# Patient Record
Sex: Male | Born: 1955
Health system: Southern US, Community
[De-identification: ages and names within clinical notes are randomized; demographics above are authoritative.]

## PROBLEM LIST (undated history)

## (undated) DIAGNOSIS — K219 Gastro-esophageal reflux disease without esophagitis: Secondary | ICD-10-CM

## (undated) DIAGNOSIS — I10 Essential (primary) hypertension: Secondary | ICD-10-CM

## (undated) DIAGNOSIS — D649 Anemia, unspecified: Secondary | ICD-10-CM

## (undated) DIAGNOSIS — E785 Hyperlipidemia, unspecified: Secondary | ICD-10-CM

## (undated) DIAGNOSIS — R011 Cardiac murmur, unspecified: Secondary | ICD-10-CM

## (undated) DIAGNOSIS — M810 Age-related osteoporosis without current pathological fracture: Secondary | ICD-10-CM

## (undated) DIAGNOSIS — I4891 Unspecified atrial fibrillation: Secondary | ICD-10-CM

## (undated) DIAGNOSIS — F32A Depression, unspecified: Secondary | ICD-10-CM

## (undated) DIAGNOSIS — F419 Anxiety disorder, unspecified: Secondary | ICD-10-CM

## (undated) DIAGNOSIS — T7840XA Allergy, unspecified, initial encounter: Secondary | ICD-10-CM

## (undated) DIAGNOSIS — M1990 Unspecified osteoarthritis, unspecified site: Secondary | ICD-10-CM

## (undated) DIAGNOSIS — E119 Type 2 diabetes mellitus without complications: Secondary | ICD-10-CM

## (undated) HISTORY — DX: Age-related osteoporosis without current pathological fracture: M81.0

## (undated) HISTORY — PX: SPINE SURGERY: SHX786

## (undated) HISTORY — DX: Essential (primary) hypertension: I10

## (undated) HISTORY — DX: Allergy, unspecified, initial encounter: T78.40XA

## (undated) HISTORY — PX: JOINT REPLACEMENT: SHX530

## (undated) HISTORY — DX: Anemia, unspecified: D64.9

## (undated) HISTORY — DX: Depression, unspecified: F32.A

## (undated) HISTORY — DX: Unspecified osteoarthritis, unspecified site: M19.90

## (undated) HISTORY — DX: Cardiac murmur, unspecified: R01.1

## (undated) HISTORY — DX: Hyperlipidemia, unspecified: E78.5

## (undated) HISTORY — PX: FOOT SURGERY: SHX648

## (undated) HISTORY — PX: HERNIA REPAIR: SHX51

## (undated) HISTORY — DX: Gastro-esophageal reflux disease without esophagitis: K21.9

## (undated) HISTORY — DX: Type 2 diabetes mellitus without complications: E11.9

## (undated) HISTORY — DX: Anxiety disorder, unspecified: F41.9

## (undated) HISTORY — PX: KNEE SURGERY: SHX244

## (undated) HISTORY — PX: FRACTURE SURGERY: SHX138

## (undated) HISTORY — PX: CHOLECYSTECTOMY: SHX55

## (undated) HISTORY — DX: Unspecified atrial fibrillation: I48.91

---

## 2011-09-01 DIAGNOSIS — M069 Rheumatoid arthritis, unspecified: Secondary | ICD-10-CM | POA: Insufficient documentation

## 2011-09-22 DIAGNOSIS — R269 Unspecified abnormalities of gait and mobility: Secondary | ICD-10-CM | POA: Insufficient documentation

## 2011-09-22 DIAGNOSIS — M2170 Unequal limb length (acquired), unspecified site: Secondary | ICD-10-CM | POA: Insufficient documentation

## 2012-07-23 DIAGNOSIS — T849XXA Unspecified complication of internal orthopedic prosthetic device, implant and graft, initial encounter: Secondary | ICD-10-CM | POA: Insufficient documentation

## 2012-09-20 DIAGNOSIS — M25569 Pain in unspecified knee: Secondary | ICD-10-CM | POA: Insufficient documentation

## 2012-09-21 DIAGNOSIS — F32A Depression, unspecified: Secondary | ICD-10-CM | POA: Insufficient documentation

## 2013-10-18 DIAGNOSIS — G4700 Insomnia, unspecified: Secondary | ICD-10-CM | POA: Insufficient documentation

## 2013-10-18 DIAGNOSIS — M545 Low back pain, unspecified: Secondary | ICD-10-CM | POA: Insufficient documentation

## 2013-10-18 DIAGNOSIS — M961 Postlaminectomy syndrome, not elsewhere classified: Secondary | ICD-10-CM | POA: Insufficient documentation

## 2013-11-18 DIAGNOSIS — N529 Male erectile dysfunction, unspecified: Secondary | ICD-10-CM | POA: Insufficient documentation

## 2014-11-26 DIAGNOSIS — Z471 Aftercare following joint replacement surgery: Secondary | ICD-10-CM | POA: Insufficient documentation

## 2015-02-05 DIAGNOSIS — G43109 Migraine with aura, not intractable, without status migrainosus: Secondary | ICD-10-CM | POA: Insufficient documentation

## 2015-02-05 DIAGNOSIS — H524 Presbyopia: Secondary | ICD-10-CM | POA: Insufficient documentation

## 2015-02-05 DIAGNOSIS — H2510 Age-related nuclear cataract, unspecified eye: Secondary | ICD-10-CM | POA: Insufficient documentation

## 2015-03-03 DIAGNOSIS — E1151 Type 2 diabetes mellitus with diabetic peripheral angiopathy without gangrene: Secondary | ICD-10-CM | POA: Insufficient documentation

## 2016-05-02 DIAGNOSIS — H04129 Dry eye syndrome of unspecified lacrimal gland: Secondary | ICD-10-CM | POA: Insufficient documentation

## 2016-08-01 DIAGNOSIS — I472 Ventricular tachycardia, unspecified: Secondary | ICD-10-CM | POA: Insufficient documentation

## 2016-12-02 DIAGNOSIS — F17200 Nicotine dependence, unspecified, uncomplicated: Secondary | ICD-10-CM | POA: Insufficient documentation

## 2018-04-17 DIAGNOSIS — E559 Vitamin D deficiency, unspecified: Secondary | ICD-10-CM | POA: Insufficient documentation

## 2018-04-17 DIAGNOSIS — E781 Pure hyperglyceridemia: Secondary | ICD-10-CM | POA: Insufficient documentation

## 2018-04-17 DIAGNOSIS — N400 Enlarged prostate without lower urinary tract symptoms: Secondary | ICD-10-CM | POA: Insufficient documentation

## 2018-04-19 DIAGNOSIS — I10 Essential (primary) hypertension: Secondary | ICD-10-CM | POA: Insufficient documentation

## 2018-04-20 DIAGNOSIS — R634 Abnormal weight loss: Secondary | ICD-10-CM | POA: Insufficient documentation

## 2018-07-13 DIAGNOSIS — Z96649 Presence of unspecified artificial hip joint: Secondary | ICD-10-CM | POA: Insufficient documentation

## 2020-02-17 DIAGNOSIS — R110 Nausea: Secondary | ICD-10-CM | POA: Diagnosis not present

## 2020-02-17 DIAGNOSIS — R1011 Right upper quadrant pain: Secondary | ICD-10-CM | POA: Diagnosis not present

## 2020-02-17 DIAGNOSIS — R1013 Epigastric pain: Secondary | ICD-10-CM | POA: Diagnosis not present

## 2020-02-17 DIAGNOSIS — Z7984 Long term (current) use of oral hypoglycemic drugs: Secondary | ICD-10-CM | POA: Diagnosis not present

## 2020-02-17 DIAGNOSIS — I10 Essential (primary) hypertension: Secondary | ICD-10-CM | POA: Diagnosis not present

## 2020-02-17 DIAGNOSIS — K5730 Diverticulosis of large intestine without perforation or abscess without bleeding: Secondary | ICD-10-CM | POA: Diagnosis not present

## 2020-02-17 DIAGNOSIS — M549 Dorsalgia, unspecified: Secondary | ICD-10-CM | POA: Diagnosis not present

## 2020-02-17 DIAGNOSIS — E119 Type 2 diabetes mellitus without complications: Secondary | ICD-10-CM | POA: Diagnosis not present

## 2020-02-17 DIAGNOSIS — E785 Hyperlipidemia, unspecified: Secondary | ICD-10-CM | POA: Diagnosis not present

## 2020-02-17 DIAGNOSIS — Z79899 Other long term (current) drug therapy: Secondary | ICD-10-CM | POA: Diagnosis not present

## 2020-02-17 DIAGNOSIS — Z886 Allergy status to analgesic agent status: Secondary | ICD-10-CM | POA: Diagnosis not present

## 2020-02-17 DIAGNOSIS — K219 Gastro-esophageal reflux disease without esophagitis: Secondary | ICD-10-CM | POA: Diagnosis not present

## 2020-02-17 DIAGNOSIS — F17210 Nicotine dependence, cigarettes, uncomplicated: Secondary | ICD-10-CM | POA: Diagnosis not present

## 2020-02-17 DIAGNOSIS — N281 Cyst of kidney, acquired: Secondary | ICD-10-CM | POA: Diagnosis not present

## 2020-02-18 DIAGNOSIS — N281 Cyst of kidney, acquired: Secondary | ICD-10-CM | POA: Diagnosis not present

## 2020-02-18 DIAGNOSIS — K5730 Diverticulosis of large intestine without perforation or abscess without bleeding: Secondary | ICD-10-CM | POA: Diagnosis not present

## 2020-02-18 DIAGNOSIS — R1013 Epigastric pain: Secondary | ICD-10-CM | POA: Diagnosis not present

## 2020-02-27 DIAGNOSIS — K824 Cholesterolosis of gallbladder: Secondary | ICD-10-CM | POA: Diagnosis not present

## 2020-02-27 DIAGNOSIS — R1011 Right upper quadrant pain: Secondary | ICD-10-CM | POA: Diagnosis not present

## 2020-03-10 DIAGNOSIS — I10 Essential (primary) hypertension: Secondary | ICD-10-CM | POA: Diagnosis not present

## 2020-03-10 DIAGNOSIS — E119 Type 2 diabetes mellitus without complications: Secondary | ICD-10-CM | POA: Diagnosis not present

## 2020-03-10 DIAGNOSIS — B353 Tinea pedis: Secondary | ICD-10-CM | POA: Diagnosis not present

## 2020-03-10 DIAGNOSIS — K219 Gastro-esophageal reflux disease without esophagitis: Secondary | ICD-10-CM | POA: Diagnosis not present

## 2020-03-13 DIAGNOSIS — R932 Abnormal findings on diagnostic imaging of liver and biliary tract: Secondary | ICD-10-CM | POA: Diagnosis not present

## 2020-03-13 DIAGNOSIS — N281 Cyst of kidney, acquired: Secondary | ICD-10-CM | POA: Diagnosis not present

## 2020-03-13 DIAGNOSIS — R1011 Right upper quadrant pain: Secondary | ICD-10-CM | POA: Diagnosis not present

## 2020-09-10 DIAGNOSIS — E119 Type 2 diabetes mellitus without complications: Secondary | ICD-10-CM | POA: Diagnosis not present

## 2020-09-10 DIAGNOSIS — F4321 Adjustment disorder with depressed mood: Secondary | ICD-10-CM | POA: Diagnosis not present

## 2020-09-10 DIAGNOSIS — R457 State of emotional shock and stress, unspecified: Secondary | ICD-10-CM | POA: Diagnosis not present

## 2020-09-10 DIAGNOSIS — I10 Essential (primary) hypertension: Secondary | ICD-10-CM | POA: Diagnosis not present

## 2020-11-05 DIAGNOSIS — F4323 Adjustment disorder with mixed anxiety and depressed mood: Secondary | ICD-10-CM | POA: Diagnosis not present

## 2020-11-05 DIAGNOSIS — K589 Irritable bowel syndrome without diarrhea: Secondary | ICD-10-CM | POA: Diagnosis not present

## 2020-11-05 DIAGNOSIS — Z23 Encounter for immunization: Secondary | ICD-10-CM | POA: Diagnosis not present

## 2020-11-05 DIAGNOSIS — E119 Type 2 diabetes mellitus without complications: Secondary | ICD-10-CM | POA: Diagnosis not present

## 2020-11-09 DIAGNOSIS — E119 Type 2 diabetes mellitus without complications: Secondary | ICD-10-CM | POA: Diagnosis not present

## 2020-12-25 DIAGNOSIS — R0602 Shortness of breath: Secondary | ICD-10-CM | POA: Diagnosis not present

## 2020-12-25 DIAGNOSIS — F419 Anxiety disorder, unspecified: Secondary | ICD-10-CM | POA: Diagnosis not present

## 2020-12-25 DIAGNOSIS — F17210 Nicotine dependence, cigarettes, uncomplicated: Secondary | ICD-10-CM | POA: Diagnosis not present

## 2020-12-25 DIAGNOSIS — E785 Hyperlipidemia, unspecified: Secondary | ICD-10-CM | POA: Diagnosis not present

## 2020-12-25 DIAGNOSIS — R55 Syncope and collapse: Secondary | ICD-10-CM | POA: Diagnosis not present

## 2020-12-25 DIAGNOSIS — Z7984 Long term (current) use of oral hypoglycemic drugs: Secondary | ICD-10-CM | POA: Diagnosis not present

## 2020-12-25 DIAGNOSIS — K219 Gastro-esophageal reflux disease without esophagitis: Secondary | ICD-10-CM | POA: Diagnosis not present

## 2020-12-25 DIAGNOSIS — Z79899 Other long term (current) drug therapy: Secondary | ICD-10-CM | POA: Diagnosis not present

## 2020-12-25 DIAGNOSIS — Z885 Allergy status to narcotic agent status: Secondary | ICD-10-CM | POA: Diagnosis not present

## 2020-12-25 DIAGNOSIS — E119 Type 2 diabetes mellitus without complications: Secondary | ICD-10-CM | POA: Diagnosis not present

## 2020-12-25 DIAGNOSIS — I10 Essential (primary) hypertension: Secondary | ICD-10-CM | POA: Diagnosis not present

## 2020-12-25 DIAGNOSIS — R079 Chest pain, unspecified: Secondary | ICD-10-CM | POA: Diagnosis not present

## 2020-12-25 DIAGNOSIS — Z7982 Long term (current) use of aspirin: Secondary | ICD-10-CM | POA: Diagnosis not present

## 2021-03-02 DIAGNOSIS — R011 Cardiac murmur, unspecified: Secondary | ICD-10-CM | POA: Diagnosis not present

## 2021-03-02 DIAGNOSIS — R072 Precordial pain: Secondary | ICD-10-CM | POA: Diagnosis not present

## 2021-03-02 DIAGNOSIS — K219 Gastro-esophageal reflux disease without esophagitis: Secondary | ICD-10-CM | POA: Diagnosis not present

## 2021-03-02 DIAGNOSIS — F419 Anxiety disorder, unspecified: Secondary | ICD-10-CM | POA: Diagnosis not present

## 2021-04-14 DIAGNOSIS — Z7982 Long term (current) use of aspirin: Secondary | ICD-10-CM | POA: Diagnosis not present

## 2021-04-14 DIAGNOSIS — I10 Essential (primary) hypertension: Secondary | ICD-10-CM | POA: Diagnosis not present

## 2021-04-14 DIAGNOSIS — N281 Cyst of kidney, acquired: Secondary | ICD-10-CM | POA: Diagnosis not present

## 2021-04-14 DIAGNOSIS — E119 Type 2 diabetes mellitus without complications: Secondary | ICD-10-CM | POA: Diagnosis not present

## 2021-04-14 DIAGNOSIS — R110 Nausea: Secondary | ICD-10-CM | POA: Diagnosis not present

## 2021-04-14 DIAGNOSIS — R1084 Generalized abdominal pain: Secondary | ICD-10-CM | POA: Diagnosis not present

## 2021-04-14 DIAGNOSIS — Z885 Allergy status to narcotic agent status: Secondary | ICD-10-CM | POA: Diagnosis not present

## 2021-04-14 DIAGNOSIS — L089 Local infection of the skin and subcutaneous tissue, unspecified: Secondary | ICD-10-CM | POA: Diagnosis not present

## 2021-04-14 DIAGNOSIS — R1011 Right upper quadrant pain: Secondary | ICD-10-CM | POA: Diagnosis not present

## 2021-04-14 DIAGNOSIS — F17210 Nicotine dependence, cigarettes, uncomplicated: Secondary | ICD-10-CM | POA: Diagnosis not present

## 2021-04-14 DIAGNOSIS — K5730 Diverticulosis of large intestine without perforation or abscess without bleeding: Secondary | ICD-10-CM | POA: Diagnosis not present

## 2021-04-14 DIAGNOSIS — K5790 Diverticulosis of intestine, part unspecified, without perforation or abscess without bleeding: Secondary | ICD-10-CM | POA: Diagnosis not present

## 2021-04-14 DIAGNOSIS — Z7984 Long term (current) use of oral hypoglycemic drugs: Secondary | ICD-10-CM | POA: Diagnosis not present

## 2021-04-14 DIAGNOSIS — K2970 Gastritis, unspecified, without bleeding: Secondary | ICD-10-CM | POA: Diagnosis not present

## 2021-04-14 DIAGNOSIS — R197 Diarrhea, unspecified: Secondary | ICD-10-CM | POA: Diagnosis not present

## 2021-04-14 DIAGNOSIS — R109 Unspecified abdominal pain: Secondary | ICD-10-CM | POA: Diagnosis not present

## 2021-04-14 DIAGNOSIS — E785 Hyperlipidemia, unspecified: Secondary | ICD-10-CM | POA: Diagnosis not present

## 2021-04-14 DIAGNOSIS — K3189 Other diseases of stomach and duodenum: Secondary | ICD-10-CM | POA: Diagnosis not present

## 2021-04-14 DIAGNOSIS — Z79899 Other long term (current) drug therapy: Secondary | ICD-10-CM | POA: Diagnosis not present

## 2021-04-14 DIAGNOSIS — R059 Cough, unspecified: Secondary | ICD-10-CM | POA: Diagnosis not present

## 2021-04-26 DIAGNOSIS — K8020 Calculus of gallbladder without cholecystitis without obstruction: Secondary | ICD-10-CM | POA: Insufficient documentation

## 2021-04-30 ENCOUNTER — Ambulatory Visit: Payer: Self-pay | Admitting: Family Medicine

## 2021-05-07 DIAGNOSIS — Z96642 Presence of left artificial hip joint: Secondary | ICD-10-CM | POA: Diagnosis not present

## 2021-05-07 DIAGNOSIS — Z7984 Long term (current) use of oral hypoglycemic drugs: Secondary | ICD-10-CM | POA: Diagnosis not present

## 2021-05-07 DIAGNOSIS — Z7982 Long term (current) use of aspirin: Secondary | ICD-10-CM | POA: Diagnosis not present

## 2021-05-07 DIAGNOSIS — I10 Essential (primary) hypertension: Secondary | ICD-10-CM | POA: Diagnosis not present

## 2021-05-07 DIAGNOSIS — E119 Type 2 diabetes mellitus without complications: Secondary | ICD-10-CM | POA: Diagnosis not present

## 2021-05-07 DIAGNOSIS — K824 Cholesterolosis of gallbladder: Secondary | ICD-10-CM | POA: Diagnosis not present

## 2021-05-07 DIAGNOSIS — E781 Pure hyperglyceridemia: Secondary | ICD-10-CM | POA: Diagnosis not present

## 2021-05-07 DIAGNOSIS — G43909 Migraine, unspecified, not intractable, without status migrainosus: Secondary | ICD-10-CM | POA: Diagnosis not present

## 2021-05-07 DIAGNOSIS — I739 Peripheral vascular disease, unspecified: Secondary | ICD-10-CM | POA: Diagnosis not present

## 2021-05-07 DIAGNOSIS — I472 Ventricular tachycardia: Secondary | ICD-10-CM | POA: Diagnosis not present

## 2021-05-07 DIAGNOSIS — K219 Gastro-esophageal reflux disease without esophagitis: Secondary | ICD-10-CM | POA: Diagnosis not present

## 2021-05-07 DIAGNOSIS — Z885 Allergy status to narcotic agent status: Secondary | ICD-10-CM | POA: Diagnosis not present

## 2021-05-07 DIAGNOSIS — K811 Chronic cholecystitis: Secondary | ICD-10-CM | POA: Diagnosis not present

## 2021-05-07 DIAGNOSIS — E785 Hyperlipidemia, unspecified: Secondary | ICD-10-CM | POA: Diagnosis not present

## 2021-05-07 DIAGNOSIS — Z79899 Other long term (current) drug therapy: Secondary | ICD-10-CM | POA: Diagnosis not present

## 2021-05-07 DIAGNOSIS — K8020 Calculus of gallbladder without cholecystitis without obstruction: Secondary | ICD-10-CM | POA: Diagnosis not present

## 2021-05-07 DIAGNOSIS — F17210 Nicotine dependence, cigarettes, uncomplicated: Secondary | ICD-10-CM | POA: Diagnosis not present

## 2021-05-13 ENCOUNTER — Ambulatory Visit: Payer: Self-pay | Admitting: Family Medicine

## 2021-11-02 ENCOUNTER — Other Ambulatory Visit: Payer: Self-pay

## 2021-11-02 ENCOUNTER — Encounter: Payer: Self-pay | Admitting: Medical-Surgical

## 2021-11-02 ENCOUNTER — Ambulatory Visit (INDEPENDENT_AMBULATORY_CARE_PROVIDER_SITE_OTHER): Payer: Federal, State, Local not specified - PPO | Admitting: Medical-Surgical

## 2021-11-02 VITALS — BP 122/72 | HR 76 | Temp 98.4°F | Ht 70.5 in | Wt 196.2 lb

## 2021-11-02 DIAGNOSIS — F32A Depression, unspecified: Secondary | ICD-10-CM

## 2021-11-02 DIAGNOSIS — I10 Essential (primary) hypertension: Secondary | ICD-10-CM

## 2021-11-02 DIAGNOSIS — E1151 Type 2 diabetes mellitus with diabetic peripheral angiopathy without gangrene: Secondary | ICD-10-CM | POA: Diagnosis not present

## 2021-11-02 DIAGNOSIS — Z0000 Encounter for general adult medical examination without abnormal findings: Secondary | ICD-10-CM

## 2021-11-02 DIAGNOSIS — Z1159 Encounter for screening for other viral diseases: Secondary | ICD-10-CM | POA: Diagnosis not present

## 2021-11-02 DIAGNOSIS — I472 Ventricular tachycardia, unspecified: Secondary | ICD-10-CM | POA: Diagnosis not present

## 2021-11-02 DIAGNOSIS — Z7689 Persons encountering health services in other specified circumstances: Secondary | ICD-10-CM | POA: Diagnosis not present

## 2021-11-02 DIAGNOSIS — Z13220 Encounter for screening for lipoid disorders: Secondary | ICD-10-CM

## 2021-11-02 DIAGNOSIS — Z13228 Encounter for screening for other metabolic disorders: Secondary | ICD-10-CM

## 2021-11-02 DIAGNOSIS — K219 Gastro-esophageal reflux disease without esophagitis: Secondary | ICD-10-CM | POA: Diagnosis not present

## 2021-11-02 DIAGNOSIS — E785 Hyperlipidemia, unspecified: Secondary | ICD-10-CM | POA: Insufficient documentation

## 2021-11-02 DIAGNOSIS — Z114 Encounter for screening for human immunodeficiency virus [HIV]: Secondary | ICD-10-CM

## 2021-11-02 DIAGNOSIS — E782 Mixed hyperlipidemia: Secondary | ICD-10-CM

## 2021-11-02 DIAGNOSIS — E781 Pure hyperglyceridemia: Secondary | ICD-10-CM | POA: Diagnosis not present

## 2021-11-02 DIAGNOSIS — F5101 Primary insomnia: Secondary | ICD-10-CM

## 2021-11-02 LAB — POCT GLYCOSYLATED HEMOGLOBIN (HGB A1C): Hemoglobin A1C: 6.3 % — AB (ref 4.0–5.6)

## 2021-11-02 NOTE — Progress Notes (Signed)
New Patient Office Visit  Subjective:  Patient ID: Richard Fuller, male    DOB: November 18, 1956  Age: 65 y.o. MRN: [id number]  CC:  Chief Complaint  Patient presents with   Establish Care     HPI Richard Fuller presents to establish care.  He is a very pleasant 65 year old male who presents today with several chronic conditions.  Hypertension-checking blood pressures at home with average systolic readings of [id number].  Taking amlodipine 5 mg daily, carvedilol 25 mg daily as needed, and enalapril 20 mg daily as prescribed, tolerating well without side effects.  He has had some episodes over the past few months where he has dizziness where he feels like he is going to pass out.  He is not sure if this is related to his blood pressure or sugar.  Usually after eating a snack and lying down for a while, his symptoms resolved.  Denies chest pain, palpitations, lower extremity edema, vision changes, headaches, and shortness of breath.  Diabetes-diagnosed 7 years ago.  He checks his blood sugar intermittently.  His last result was 160 after breakfast yesterday morning.  He takes his metformin differently than prescribed as he is unable to tolerate twice a day dosing.  He reports taking 1/2 tablet every morning.  Hyperlipidemia-taking fenofibrate as prescribed, tolerating well without side effects.  GERD-does have significant issues with GERD and feels like his stomach is raw at times.  Taking Protonix 40 mg daily which she reports helps manage his symptoms.  Insomnia-using Ambien 1/2 tablet at bedtime as needed.  Uses this most nights and notes that it does help without side effects.  Mood-taking Lexapro 10 mg daily, tolerating well without side effects.  Notes that he had quite a bit of a rough time with his dad's passing and his siblings behavior surrounding that time.  Notes that he stays to himself and does not talk to his siblings much anymore because of it.  Feels that the Lexapro keeps his mood stable and  helps prevent emotional outburst.  Chronic pain-was previously seen at a pain clinic where they had him on a couple of different opioid pain medications.  Notes that he felt horrible on these medications and decided he would just live with the pain.  Admits to smoking marijuana a couple of times a week which helps tremendously with pain control for him.  Past Medical History:  Diagnosis Date   Anxiety    Atrial fibrillation ([place])    Depression    Diabetes mellitus without complication ([place])    Hyperlipidemia    Hypertension     Past Surgical History:  Procedure Laterality Date   CHOLECYSTECTOMY     FOOT SURGERY     HERNIA REPAIR     KNEE SURGERY      Family History  Problem Relation Age of Onset   Diabetes Mother    Hypertension Mother    Breast cancer Mother    Diabetes Brother    Hypertension Brother    Hypertension Maternal Grandfather    Diabetes Maternal Grandfather     Social History   Socioeconomic History   Marital status: Married    Spouse name: Not on file   Number of children: Not on file   Years of education: Not on file   Highest education level: Not on file  Occupational History   Not on file  Tobacco Use   Smoking status: Every Day    Types: Cigarettes   Smokeless tobacco: Never  Substance and  Sexual Activity   Alcohol use: Not Currently   Drug use: Yes    Types: Marijuana    Comment: 1-2 uses per week   Sexual activity: Yes    Partners: Female  Other Topics Concern   Not on file  Social History Narrative   Not on file   Social Determinants of Health   Financial Resource Strain: Not on file  Food Insecurity: Not on file  Transportation Needs: Not on file  Physical Activity: Not on file  Stress: Not on file  Social Connections: Not on file  Intimate Partner Violence: Not on file    ROS Review of Systems  Constitutional:  Negative for appetite change, chills, fatigue, fever and unexpected weight change.  Eyes:  Negative for visual  disturbance.  Respiratory:  Negative for cough, chest tightness, shortness of breath and wheezing.   Cardiovascular:  Negative for chest pain, palpitations and leg swelling.  Gastrointestinal:  Positive for abdominal pain. Negative for blood in stool, constipation, diarrhea, nausea and vomiting.  Endocrine: Negative for cold intolerance, heat intolerance, polydipsia, polyphagia and polyuria.  Genitourinary:  Negative for dysuria, frequency, hematuria and urgency.  Musculoskeletal:  Positive for arthralgias and myalgias.  Neurological:  Positive for light-headedness. Negative for dizziness, syncope and headaches.  Psychiatric/Behavioral:  Negative for dysphoric mood, self-injury, sleep disturbance and suicidal ideas. The patient is not nervous/anxious.    Objective:   Today's Vitals: BP 122/72   Pulse 76   Temp 98.4 F (36.9 C)   Ht 5' 10.5" (1.791 m)   Wt 196 lb 3.2 oz (89 kg)   SpO2 98%   BMI 27.75 kg/m   Physical Exam Vitals and nursing note reviewed.  Constitutional:      General: He is not in acute distress.    Appearance: Normal appearance. He is not ill-appearing.  HENT:     Head: Normocephalic and atraumatic.  Cardiovascular:     Rate and Rhythm: Normal rate and regular rhythm.     Pulses: Normal pulses.     Heart sounds: Normal heart sounds. No murmur heard.   No friction rub. No gallop.  Pulmonary:     Effort: Pulmonary effort is normal. No respiratory distress.     Breath sounds: Normal breath sounds.  Skin:    General: Skin is warm and dry.  Neurological:     Mental Status: He is alert and oriented to person, place, and time.  Psychiatric:        Mood and Affect: Mood normal.        Behavior: Behavior normal.        Thought Content: Thought content normal.        Judgment: Judgment normal.    Assessment & Plan:   1. Encounter to establish care Reviewed available information and discussed care concerns with patient.   2. Diabetes mellitus with peripheral  vascular disease (HCC) POCT hemoglobin A1c 6.3% today indicating good control of diabetes.  Continue metformin as tolerated.  Checking labs as below. - Lipid panel - COMPLETE METABOLIC PANEL WITH GFR - CBC with Differential/Platelet - POCT glycosylated hemoglobin (Hb A1C)  3. Gastroesophageal reflux disease without esophagitis Continue Protonix 40 mg daily.  If symptoms not well controlled, consider adding famotidine 20 mg at bedtime as needed.  4. Mixed hyperlipidemia Checking lipid panel.  Continue fenofibrate as prescribed. - Lipid panel  5. Primary hypertension Checking labs as below.  Blood pressure well controlled so continue enalapril, amlodipine, and carvedilol as prescribed. - Lipid panel -  COMPLETE METABOLIC PANEL WITH GFR - CBC with Differential/Platelet  6. Primary insomnia Continue Ambien 5-10 mg nightly as needed.  7. Depression, unspecified depression type Continue Lexapro 10 mg daily.  Symptoms well controlled.  8. Screening for metabolic disorder Checking TSH. - TSH  9. Need for hepatitis C screening test 10. Screening for HIV (human immunodeficiency virus) Discussed screening recommendations.  Patient is agreeable so adding to blood work today. - Hepatitis C antibody - HIV Antibody (routine testing w rflx)   Outpatient Encounter Medications as of 11/02/2021  Medication Sig   amLODipine (NORVASC) 5 MG tablet Take 5 mg by mouth daily.   ASPIRIN LOW DOSE 81 MG EC tablet Take 81 mg by mouth daily.   carvedilol (COREG) 25 MG tablet Take 1 tablet by mouth daily as needed.   dicyclomine (BENTYL) 20 MG tablet Take 20 mg by mouth 3 (three) times daily.   enalapril (VASOTEC) 20 MG tablet Take 20 mg by mouth 2 (two) times daily.   escitalopram (LEXAPRO) 10 MG tablet Take 10 mg by mouth daily.   fenofibrate (TRICOR) 48 MG tablet Take 48 mg by mouth daily.   gabapentin (NEURONTIN) 300 MG capsule Take 300 mg by mouth 3 (three) times daily.   metFORMIN (GLUCOPHAGE)  500 MG tablet Take 500 mg by mouth 2 (two) times daily.   pantoprazole (PROTONIX) 40 MG tablet Take 40 mg by mouth 2 (two) times daily.   zolpidem (AMBIEN) 10 MG tablet Take 10 mg by mouth at bedtime as needed.   No facility-administered encounter medications on file as of 11/02/2021.    Follow-up: Return in about 6 months (around 05/02/2022) for chronic disease follow up.   Clearnce Sorrel, DNP, APRN, FNP-BC [facility]

## 2021-11-04 LAB — LIPID PANEL
Cholesterol: 171 mg/dL (ref <200)
HDL: 47 mg/dL (ref >=40)
LDL Cholesterol (Calc): 103 mg/dL (calc) — ABNORMAL HIGH
Non-HDL Cholesterol (Calc): 124 mg/dL (calc) (ref <130)
Total CHOL/HDL Ratio: 3.6 (calc) (ref <5.0)
Triglycerides: 113 mg/dL (ref <150)

## 2021-11-04 LAB — COMPLETE METABOLIC PANEL WITH GFR
AG Ratio: 1.7 (calc) (ref 1.0–2.5)
ALT: 19 U/L (ref 9–46)
AST: 26 U/L (ref 10–35)
Albumin: 4.7 g/dL (ref 3.6–5.1)
Alkaline phosphatase (APISO): 41 U/L (ref 35–144)
BUN: 15 mg/dL (ref 7–25)
CO2: 22 mmol/L (ref 20–32)
Calcium: 9.8 mg/dL (ref 8.6–10.3)
Chloride: 106 mmol/L (ref 98–110)
Creat: 0.91 mg/dL (ref 0.70–1.35)
Globulin: 2.8 g/dL (calc) (ref 1.9–3.7)
Glucose, Bld: 136 mg/dL (ref 65–139)
Potassium: 4.5 mmol/L (ref 3.5–5.3)
Sodium: 141 mmol/L (ref 135–146)
Total Bilirubin: 0.7 mg/dL (ref 0.2–1.2)
Total Protein: 7.5 g/dL (ref 6.1–8.1)
eGFR: 94 mL/min/{1.73_m2} (ref >=60)

## 2021-11-04 LAB — CBC WITH DIFFERENTIAL/PLATELET
Absolute Monocytes: 498 cells/uL (ref 200–950)
Basophils Absolute: 9 cells/uL (ref 0–200)
Basophils Relative: 0.2 %
Eosinophils Absolute: 202 cells/uL (ref 15–500)
Eosinophils Relative: 4.3 %
HCT: 40.2 % (ref 38.5–50.0)
Hemoglobin: 13.7 g/dL (ref 13.2–17.1)
Lymphs Abs: 949 cells/uL (ref 850–3900)
MCH: 31.8 pg (ref 27.0–33.0)
MCHC: 34.1 g/dL (ref 32.0–36.0)
MCV: 93.3 fL (ref 80.0–100.0)
MPV: 10.7 fL (ref 7.5–12.5)
Monocytes Relative: 10.6 %
Neutro Abs: 3041 cells/uL (ref 1500–7800)
Neutrophils Relative %: 64.7 %
Platelets: 210 10*3/uL (ref 140–400)
RBC: 4.31 10*6/uL (ref 4.20–5.80)
RDW: 13 % (ref 11.0–15.0)
Total Lymphocyte: 20.2 %
WBC: 4.7 10*3/uL (ref 3.8–10.8)

## 2021-11-04 LAB — HEPATITIS C ANTIBODY
Hepatitis C Ab: NONREACTIVE
SIGNAL TO CUT-OFF: 0.12 (ref <1.00)

## 2021-11-04 LAB — HIV ANTIBODY (ROUTINE TESTING W REFLEX): HIV 1&2 Ab, 4th Generation: NONREACTIVE

## 2021-11-04 LAB — TSH: TSH: 0.75 mIU/L (ref 0.40–4.50)

## 2021-11-04 MED ORDER — PRAVASTATIN SODIUM 10 MG PO TABS: 10.0000 mg | ORAL_TABLET | Freq: Every day | ORAL | 0 refills | Status: DC

## 2021-11-04 NOTE — Addendum Note (Signed)
Addended byChristen Butter on: 11/04/2021 12:39 PM   Modules accepted: Orders

## 2022-01-28 DIAGNOSIS — E785 Hyperlipidemia, unspecified: Secondary | ICD-10-CM | POA: Diagnosis not present

## 2022-01-28 DIAGNOSIS — R1110 Vomiting, unspecified: Secondary | ICD-10-CM | POA: Diagnosis not present

## 2022-01-28 DIAGNOSIS — Z7984 Long term (current) use of oral hypoglycemic drugs: Secondary | ICD-10-CM | POA: Diagnosis not present

## 2022-01-28 DIAGNOSIS — R509 Fever, unspecified: Secondary | ICD-10-CM | POA: Diagnosis not present

## 2022-01-28 DIAGNOSIS — R638 Other symptoms and signs concerning food and fluid intake: Secondary | ICD-10-CM | POA: Diagnosis not present

## 2022-01-28 DIAGNOSIS — R6883 Chills (without fever): Secondary | ICD-10-CM | POA: Diagnosis not present

## 2022-01-28 DIAGNOSIS — Z79899 Other long term (current) drug therapy: Secondary | ICD-10-CM | POA: Diagnosis not present

## 2022-01-28 DIAGNOSIS — R5383 Other fatigue: Secondary | ICD-10-CM | POA: Diagnosis not present

## 2022-01-28 DIAGNOSIS — Z885 Allergy status to narcotic agent status: Secondary | ICD-10-CM | POA: Diagnosis not present

## 2022-01-28 DIAGNOSIS — R059 Cough, unspecified: Secondary | ICD-10-CM | POA: Diagnosis not present

## 2022-01-28 DIAGNOSIS — K219 Gastro-esophageal reflux disease without esophagitis: Secondary | ICD-10-CM | POA: Diagnosis not present

## 2022-01-28 DIAGNOSIS — I10 Essential (primary) hypertension: Secondary | ICD-10-CM | POA: Diagnosis not present

## 2022-01-28 DIAGNOSIS — Z7982 Long term (current) use of aspirin: Secondary | ICD-10-CM | POA: Diagnosis not present

## 2022-01-28 DIAGNOSIS — U071 COVID-19: Secondary | ICD-10-CM | POA: Diagnosis not present

## 2022-01-28 DIAGNOSIS — F17210 Nicotine dependence, cigarettes, uncomplicated: Secondary | ICD-10-CM | POA: Diagnosis not present

## 2022-01-28 DIAGNOSIS — E1169 Type 2 diabetes mellitus with other specified complication: Secondary | ICD-10-CM | POA: Diagnosis not present

## 2022-01-31 ENCOUNTER — Telehealth: Payer: Self-pay | Admitting: General Practice

## 2022-01-31 NOTE — Telephone Encounter (Signed)
Transition Care Management Unsuccessful Follow-up Telephone Call  Date of discharge and from where:  01/28/22 from [facility]  Attempts:  1st Attempt  Reason for unsuccessful TCM follow-up call:  Left voice message

## 2022-02-02 NOTE — Telephone Encounter (Addendum)
Transition Care Management Unsuccessful Follow-up Telephone Call  Date of discharge and from where:  01/28/22 from [facility]  Attempts:  2nd attempt  Reason for unsuccessful TCM follow-up call:  Left voice message

## 2022-02-03 ENCOUNTER — Other Ambulatory Visit: Payer: Self-pay | Admitting: Medical-Surgical

## 2022-02-04 NOTE — Telephone Encounter (Signed)
Transition Care Management Unsuccessful Follow-up Telephone Call  Date of discharge and from where:  01/28/22 from [facility]  Attempts:  3rd Attempt  Reason for unsuccessful TCM follow-up call:  No answer/busy

## 2022-02-25 ENCOUNTER — Ambulatory Visit: Payer: Medicare Other | Admitting: Medical-Surgical

## 2022-03-01 ENCOUNTER — Ambulatory Visit (INDEPENDENT_AMBULATORY_CARE_PROVIDER_SITE_OTHER): Payer: Medicare Other

## 2022-03-01 ENCOUNTER — Ambulatory Visit (INDEPENDENT_AMBULATORY_CARE_PROVIDER_SITE_OTHER): Payer: Medicare Other | Admitting: Sports Medicine

## 2022-03-01 ENCOUNTER — Other Ambulatory Visit: Payer: Self-pay

## 2022-03-01 DIAGNOSIS — M961 Postlaminectomy syndrome, not elsewhere classified: Secondary | ICD-10-CM

## 2022-03-01 DIAGNOSIS — M545 Low back pain, unspecified: Secondary | ICD-10-CM

## 2022-03-01 DIAGNOSIS — M7542 Impingement syndrome of left shoulder: Secondary | ICD-10-CM | POA: Diagnosis not present

## 2022-03-01 DIAGNOSIS — Z96649 Presence of unspecified artificial hip joint: Secondary | ICD-10-CM

## 2022-03-01 DIAGNOSIS — M2578 Osteophyte, vertebrae: Secondary | ICD-10-CM | POA: Diagnosis not present

## 2022-03-01 DIAGNOSIS — M19012 Primary osteoarthritis, left shoulder: Secondary | ICD-10-CM | POA: Diagnosis not present

## 2022-03-01 DIAGNOSIS — Z96642 Presence of left artificial hip joint: Secondary | ICD-10-CM | POA: Diagnosis not present

## 2022-03-01 DIAGNOSIS — Z471 Aftercare following joint replacement surgery: Secondary | ICD-10-CM | POA: Diagnosis not present

## 2022-03-01 MED ORDER — GABAPENTIN 300 MG PO CAPS: ORAL_CAPSULE | ORAL | 3 refills | Status: DC

## 2022-03-01 MED ORDER — MELOXICAM 15 MG PO TABS: ORAL_TABLET | ORAL | 3 refills | Status: DC

## 2022-03-01 MED ORDER — PREDNISONE 50 MG PO TABS: ORAL_TABLET | ORAL | 0 refills | Status: DC

## 2022-03-01 NOTE — Assessment & Plan Note (Signed)
Impingement pain and signs on exam today, moderate pain, history of cuff repair. ?Adding x-rays, formal physical therapy. ?

## 2022-03-01 NOTE — Assessment & Plan Note (Signed)
Richard Fuller has had multiple revisions of a left total hip arthroplasty secondary to dislocations, he has pain in the buttock, his hip motion is good, I think his pain is more from his back rather than his hip arthroplasty. ?

## 2022-03-01 NOTE — Assessment & Plan Note (Signed)
Mit is a pleasant 66 year old male, he has chronic axial low back pain with radiation into the left buttock, thigh but not past the knee. ?He has had multiple injections, surgeries. ?Continues to have discomfort. ?This is likely failed back surgery syndrome and he is not interested in additional injections or surgery. ?We did discuss the natural history of back pain, we will get him out of his current pain with steroids followed by meloxicam, he is also only on 300 mg of Neurontin, he understands we need to obtain for this aggressively. ?I am also going to get him some physical therapy near his apartment. ?Return to see me in 6 weeks. ?

## 2022-03-01 NOTE — Progress Notes (Signed)
? ? ?  Procedures performed today:   ? ?None. ? ?Independent interpretation of notes and tests performed by another provider:  ? ?None. ? ?Brief History, Exam, Impression, and Recommendations:   ? ?Failed back surgical syndrome ?Richard Fuller is a pleasant 66 year old male, he has chronic axial low back pain with radiation into the left buttock, thigh but not past the knee. ?He has had multiple injections, surgeries. ?Continues to have discomfort. ?This is likely failed back surgery syndrome and he is not interested in additional injections or surgery. ?We did discuss the natural history of back pain, we will get him out of his current pain with steroids followed by meloxicam, he is also only on 300 mg of Neurontin, he understands we need to obtain for this aggressively. ?I am also going to get him some physical therapy near his apartment. ?Return to see me in 6 weeks. ? ?Impingement syndrome, shoulder, left ?Impingement pain and signs on exam today, moderate pain, history of cuff repair. ?Adding x-rays, formal physical therapy. ? ?S/P revision of total hip ?Richard Fuller has had multiple revisions of a left total hip arthroplasty secondary to dislocations, he has pain in the buttock, his hip motion is good, I think his pain is more from his back rather than his hip arthroplasty. ? ? ? ?___________________________________________ ?Richard Fuller. Richard Fuller, M.D., ABFM., CAQSM. ?Primary Care and Sports Medicine ?[facility] ? ?Adjunct Instructor of Family Medicine  ?[employer] ?

## 2022-04-11 ENCOUNTER — Other Ambulatory Visit: Payer: Self-pay | Admitting: Medical-Surgical

## 2022-04-11 NOTE — Telephone Encounter (Signed)
Ambien on med list but since he established, has been prescribed by Lyndon Code, PA. Will be happy to take over the prescription but cannot have two providers prescribing the same medication, especially a controlled substance.  ?

## 2022-04-12 ENCOUNTER — Ambulatory Visit: Payer: Federal, State, Local not specified - PPO | Admitting: Sports Medicine

## 2022-04-14 ENCOUNTER — Other Ambulatory Visit: Payer: Self-pay

## 2022-04-14 MED ORDER — ZOLPIDEM TARTRATE 10 MG PO TABS
10.0000 mg | ORAL_TABLET | Freq: Every evening | ORAL | 0 refills | Status: DC | PRN
  Filled 2022-04-14: qty 30, 30d supply, fill #0

## 2022-04-14 NOTE — Telephone Encounter (Signed)
30 day supply sent to the pharmacy. Follow up scheduled for 05/02/2022. Please keep appointment to prevent delay in further refills. ? ?___________________________________________ ?Thayer Ohm, DNP, APRN, FNP-BC ?Primary Care and Sports Medicine ?[facility] ? ?

## 2022-04-20 ENCOUNTER — Ambulatory Visit: Payer: Medicare Other | Admitting: Sports Medicine

## 2022-05-01 NOTE — Progress Notes (Signed)
?HPI with pertinent ROS:  ? ?CC: chronic disease follow up ? ?HPI: ?Pleasant 66 year old male presenting today for follow up on: ? ?DM: the last couple of days, sugars elevated at 220s after eating. Taking Metformin 1/2 tablet twice daily. Vision changes and lightheadedness with elevations in sugars.  ? ?HTN: taking Amlodipine 5mg , Coreg 25mg  once daily as needed, Enalapril 20mg  twice daily. Tolerating medication well without side effects.  Does have a blood pressure cuff at home but needs new batteries and has not been checking regularly for the past few days.  Denies CP, SOB, palpitations, lower extremity edema, dizziness, headaches, or vision changes. ? ?Migraine: has had occasional headaches but no migraines.  ? ?GERD: Taking protonix 40mg  daily, tolerating well without side effects.  Has been on this medication long-term and has reduced from 40 mg twice daily.  Symptoms well managed. ? ?Tobacco use: smoking about 1/2ppd cigarettes or less, often doesn't finish a cigarette.  Working on cutting back even further. ? ?HLD: taking pravastatin 10mg  and fenofibrate 48mg  daily, tolerating well without side effects.  ? ?Insomnia: using Ambien 10mg  nightly as needed, most nights due to trouble getting to sleep. Tolerating well without side effects.  ? ?Vitamin D deficiency: taking a daily MVI.  ? ?Chronic pain: taking 600mg  twice daily, unable to tolerate higher dosing due to sedation.  Continues to have significant low back and left hip pain.  Plans to call his orthopedic surgeon regarding placing the hip again. ? ?Lump on his right upper thigh that has been present for quite a while.  This occasionally gets tender and he is interested in getting it removed. ? ?I reviewed the past medical history, family history, social history, surgical history, and allergies today and no changes were needed.  Please see the problem list section below in epic for further details. ? ? ?Physical exam:  ? ?General: Well Developed, well  nourished, and in no acute distress.  ?Neuro: Alert and oriented x3.  ?HEENT: Normocephalic, atraumatic.  ?Skin: Warm and dry.  Approximately 2 cm round sebaceous cyst to the right anterior thigh, slight bruising. ?Cardiac: Regular rate and rhythm, no murmurs rubs or gallops, no lower extremity edema.  ?Respiratory: Clear to auscultation bilaterally. Not using accessory muscles, speaking in full sentences. ? ?Impression and Recommendations:   ? ?1. Diabetes mellitus with peripheral vascular disease ([place]) ?Checking CMP and hemoglobin A1c.  Recommend increasing metformin to 500 mg twice daily rather than taking the half tablet each time.  Checking sugars fasting would be very helpful to give [place] an idea of his overall glucose control. ?- COMPLETE METABOLIC PANEL WITH GFR ?- Hemoglobin A1c ? ?2. Primary hypertension ?Checking labs today.  Continue amlodipine, enalapril, and as needed carvedilol.  Recommend checking blood pressure at home with goal of 130/80 or less.  If consistently higher, return for further evaluation.  Recommend low-sodium diet as well as regular intentional exercise. ?- CBC with Differential/Platelet ?- COMPLETE METABOLIC PANEL WITH GFR ?- Lipid panel ? ?3. Migraine with aura and without status migrainosus, not intractable ?Stable. ? ?4. Gastroesophageal reflux disease without esophagitis ?Stable.  Continue Protonix 40 mg once daily. ? ?5. Current every day smoker ?Recommend smoking cessation.  Patient aware of risk versus benefits of tobacco use and is working to cut back. ? ?6. Mixed hyperlipidemia ?7. Hypertriglyceridemia ?Checking lipid panel today.  Continue pravastatin and fenofibrate as prescribed. ?- Lipid panel ? ?8. Primary insomnia ?Continue Ambien 10 mg nightly as needed.  May consider cutting this  back to 5 mg as needed for safety. ? ?9. Vitamin D deficiency ?Checking vitamin D. ?- VITAMIN D 25 Hydroxy (Vit-D Deficiency, Fractures) ? ?10. Thyroid disorder screen ?Checking TSH. ?-  TSH ? ?No follow-ups on file. ?___________________________________________ ?Clearnce Sorrel, DNP, APRN, FNP-BC ?Primary Care and Sports Medicine ?[place] ?

## 2022-05-02 ENCOUNTER — Encounter: Payer: Self-pay | Admitting: Medical-Surgical

## 2022-05-02 ENCOUNTER — Ambulatory Visit (INDEPENDENT_AMBULATORY_CARE_PROVIDER_SITE_OTHER): Payer: Federal, State, Local not specified - PPO | Admitting: Medical-Surgical

## 2022-05-02 VITALS — BP 141/86 | HR 67 | Resp 20 | Ht 70.5 in | Wt 192.1 lb

## 2022-05-02 DIAGNOSIS — I10 Essential (primary) hypertension: Secondary | ICD-10-CM | POA: Diagnosis not present

## 2022-05-02 DIAGNOSIS — K219 Gastro-esophageal reflux disease without esophagitis: Secondary | ICD-10-CM

## 2022-05-02 DIAGNOSIS — Z1329 Encounter for screening for other suspected endocrine disorder: Secondary | ICD-10-CM

## 2022-05-02 DIAGNOSIS — E559 Vitamin D deficiency, unspecified: Secondary | ICD-10-CM

## 2022-05-02 DIAGNOSIS — E1151 Type 2 diabetes mellitus with diabetic peripheral angiopathy without gangrene: Secondary | ICD-10-CM

## 2022-05-02 DIAGNOSIS — F5101 Primary insomnia: Secondary | ICD-10-CM

## 2022-05-02 DIAGNOSIS — F17200 Nicotine dependence, unspecified, uncomplicated: Secondary | ICD-10-CM

## 2022-05-02 DIAGNOSIS — E781 Pure hyperglyceridemia: Secondary | ICD-10-CM | POA: Diagnosis not present

## 2022-05-02 DIAGNOSIS — G43109 Migraine with aura, not intractable, without status migrainosus: Secondary | ICD-10-CM | POA: Diagnosis not present

## 2022-05-02 DIAGNOSIS — E782 Mixed hyperlipidemia: Secondary | ICD-10-CM

## 2022-05-02 MED ORDER — PANTOPRAZOLE SODIUM 40 MG PO TBEC: 40.0000 mg | DELAYED_RELEASE_TABLET | Freq: Every day | ORAL | 1 refills | Status: DC

## 2022-05-02 MED ORDER — PRAVASTATIN SODIUM 10 MG PO TABS
ORAL_TABLET | ORAL | 1 refills | Status: DC
Start: 2022-05-02 — End: 2022-05-03

## 2022-05-02 MED ORDER — AMLODIPINE BESYLATE 5 MG PO TABS: 5.0000 mg | ORAL_TABLET | Freq: Every day | ORAL | 1 refills | Status: DC

## 2022-05-02 MED ORDER — METFORMIN HCL 500 MG PO TABS: 500.0000 mg | ORAL_TABLET | Freq: Two times a day (BID) | ORAL | 1 refills | Status: DC

## 2022-05-02 MED ORDER — ENALAPRIL MALEATE 20 MG PO TABS: 20.0000 mg | ORAL_TABLET | Freq: Two times a day (BID) | ORAL | 1 refills | Status: DC

## 2022-05-02 MED ORDER — ESCITALOPRAM OXALATE 10 MG PO TABS: 10.0000 mg | ORAL_TABLET | Freq: Every day | ORAL | 1 refills | Status: DC

## 2022-05-02 MED ORDER — ASPIRIN LOW DOSE 81 MG PO TBEC: 81.0000 mg | DELAYED_RELEASE_TABLET | Freq: Every day | ORAL | 1 refills | Status: DC

## 2022-05-02 MED ORDER — ZOLPIDEM TARTRATE 10 MG PO TABS: 10.0000 mg | ORAL_TABLET | Freq: Every evening | ORAL | 0 refills | Status: DC | PRN

## 2022-05-02 MED ORDER — FENOFIBRATE 48 MG PO TABS: 48.0000 mg | ORAL_TABLET | Freq: Every day | ORAL | 1 refills | Status: DC

## 2022-05-02 MED ORDER — CARVEDILOL 25 MG PO TABS: 25.0000 mg | ORAL_TABLET | Freq: Every day | ORAL | 1 refills | Status: DC | PRN

## 2022-05-03 ENCOUNTER — Other Ambulatory Visit: Payer: Self-pay | Admitting: Medical-Surgical

## 2022-05-03 LAB — LIPID PANEL
Cholesterol: 163 mg/dL (ref <200)
HDL: 40 mg/dL (ref >=40)
LDL Cholesterol (Calc): 100 mg/dL (calc) — ABNORMAL HIGH
Non-HDL Cholesterol (Calc): 123 mg/dL (calc) (ref <130)
Total CHOL/HDL Ratio: 4.1 (calc) (ref <5.0)
Triglycerides: 129 mg/dL (ref <150)

## 2022-05-03 LAB — VITAMIN D 25 HYDROXY (VIT D DEFICIENCY, FRACTURES): Vit D, 25-Hydroxy: 22 ng/mL — ABNORMAL LOW (ref 30–100)

## 2022-05-03 LAB — COMPLETE METABOLIC PANEL WITH GFR
AG Ratio: 1.6 (calc) (ref 1.0–2.5)
ALT: 15 U/L (ref 9–46)
AST: 18 U/L (ref 10–35)
Albumin: 4.5 g/dL (ref 3.6–5.1)
Alkaline phosphatase (APISO): 46 U/L (ref 35–144)
BUN: 11 mg/dL (ref 7–25)
CO2: 28 mmol/L (ref 20–32)
Calcium: 9.4 mg/dL (ref 8.6–10.3)
Chloride: 104 mmol/L (ref 98–110)
Creat: 1.08 mg/dL (ref 0.70–1.35)
Globulin: 2.9 g/dL (calc) (ref 1.9–3.7)
Glucose, Bld: 184 mg/dL — ABNORMAL HIGH (ref 65–99)
Potassium: 4.3 mmol/L (ref 3.5–5.3)
Sodium: 139 mmol/L (ref 135–146)
Total Bilirubin: 0.5 mg/dL (ref 0.2–1.2)
Total Protein: 7.4 g/dL (ref 6.1–8.1)
eGFR: 76 mL/min/{1.73_m2} (ref >=60)

## 2022-05-03 LAB — CBC WITH DIFFERENTIAL/PLATELET
Absolute Monocytes: 463 cells/uL (ref 200–950)
Basophils Absolute: 31 cells/uL (ref 0–200)
Basophils Relative: 0.6 %
Eosinophils Absolute: 151 cells/uL (ref 15–500)
Eosinophils Relative: 2.9 %
HCT: 41.7 % (ref 38.5–50.0)
Hemoglobin: 13.8 g/dL (ref 13.2–17.1)
Lymphs Abs: 1310 cells/uL (ref 850–3900)
MCH: 31.4 pg (ref 27.0–33.0)
MCHC: 33.1 g/dL (ref 32.0–36.0)
MCV: 94.8 fL (ref 80.0–100.0)
MPV: 10.8 fL (ref 7.5–12.5)
Monocytes Relative: 8.9 %
Neutro Abs: 3245 cells/uL (ref 1500–7800)
Neutrophils Relative %: 62.4 %
Platelets: 216 10*3/uL (ref 140–400)
RBC: 4.4 10*6/uL (ref 4.20–5.80)
RDW: 12.9 % (ref 11.0–15.0)
Total Lymphocyte: 25.2 %
WBC: 5.2 10*3/uL (ref 3.8–10.8)

## 2022-05-03 LAB — HEMOGLOBIN A1C
Hgb A1c MFr Bld: 6.9 % of total Hgb — ABNORMAL HIGH (ref <5.7)
Mean Plasma Glucose: 151 mg/dL
eAG (mmol/L): 8.4 mmol/L

## 2022-05-03 LAB — TSH: TSH: 0.95 mIU/L (ref 0.40–4.50)

## 2022-05-03 MED ORDER — PRAVASTATIN SODIUM 20 MG PO TABS: ORAL_TABLET | ORAL | 1 refills | Status: DC

## 2022-05-16 ENCOUNTER — Ambulatory Visit (INDEPENDENT_AMBULATORY_CARE_PROVIDER_SITE_OTHER): Payer: Federal, State, Local not specified - PPO | Admitting: Medical-Surgical

## 2022-05-16 VITALS — BP 143/84 | HR 78

## 2022-05-16 DIAGNOSIS — I10 Essential (primary) hypertension: Secondary | ICD-10-CM

## 2022-05-16 DIAGNOSIS — M545 Low back pain, unspecified: Secondary | ICD-10-CM

## 2022-05-16 MED ORDER — METHYLPREDNISOLONE ACETATE 80 MG/ML IJ SUSP
80.0000 mg | Freq: Once | INTRAMUSCULAR | Status: AC
  Administered 2022-05-16: 80 mg via INTRAMUSCULAR

## 2022-05-16 MED ORDER — TRAMADOL HCL 50 MG PO TABS: 50.0000 mg | ORAL_TABLET | Freq: Three times a day (TID) | ORAL | 0 refills | Status: AC | PRN

## 2022-05-16 MED ORDER — CYCLOBENZAPRINE HCL 10 MG PO TABS: 10.0000 mg | ORAL_TABLET | Freq: Three times a day (TID) | ORAL | 0 refills | Status: DC | PRN

## 2022-05-16 MED ORDER — KETOROLAC TROMETHAMINE 60 MG/2ML IM SOLN
60.0000 mg | Freq: Once | INTRAMUSCULAR | Status: AC
  Administered 2022-05-16: 60 mg via INTRAMUSCULAR

## 2022-05-16 NOTE — Progress Notes (Signed)
   Established Patient Office Visit  Subjective   Patient ID: Richard Fuller, male    DOB: November 10, 1956  Age: 66 y.o. MRN: [id number]  Chief Complaint  Patient presents with   Hypertension    HPI  Richard Fuller is here for blood pressure check. Denies chest pain, shortness of breath or dizziness.   He did bring in his home monitor. Home monitor 148/97 office monitor 148/77  Home readings 146/89 128/90 115/74 131/88 130/87 127/85 131/88 118/82 154/101  He is having a lot of back pain today. He believes it is coming from his hips.   ROS    Objective:     BP (!) 148/77   Pulse 78   SpO2 99%    Physical Exam   No results found for any visits on 05/16/22.    The 10-year ASCVD risk score (Arnett DK, et al., 2019) is: 42.7%    Assessment & Plan:   Problem List Items Addressed This Visit     Hypertension - Primary    No follow-ups on file.    Esmond Harps, CMA

## 2022-05-16 NOTE — Assessment & Plan Note (Signed)
BP elevated today but home readings have been fairly good and at goal. Pain is definitely a consideration today. For now, continue medications as prescribed. Monitor BP at home with a goal of 130/80 or less. Low sodium diet and regular intentional exercise is recommended.

## 2022-05-16 NOTE — Assessment & Plan Note (Signed)
Declined oral steroids. Depo-Medrol 80mg  IM x 1 in office. Does not tolerate oral NSAIDs per patient report. Toradol 60mg  IM x 1 in office. Flexeril 10mg  TID prn, advised to avoid driving until he knows how the medications affect him. Small quantity of Tramadol 50mg  every 8 hours prn. Exercises provided for home. If in 4-6 weeks no improvement, return for further evaluation with me or Dr. .

## 2022-05-16 NOTE — Progress Notes (Signed)
Established Patient Office Visit  Subjective   Patient ID: Richard Fuller, male    DOB: 1956-07-12  Age: 66 y.o. MRN: [id number]  Chief Complaint  Patient presents with   Hypertension    Hypertension Pertinent negatives include no chest pain, headaches, malaise/fatigue, palpitations or shortness of breath.  Pleasant 66 year old male presenting originally for a nurse visit for BP check however he complained of severe back pain. Notes that he picked up his grandchild yesterday and developed significant bilateral lower back pain. He tried some measures at home including soaking in an Epsom salt bath which was somewhat helpful. This morning when he woke up, he was very sore and in a lot of pain. No radiation of the pain to the lower extremities. No numbness, tingling, or weakness of the legs. No new incontinence concerns.   Review of Systems  Constitutional:  Negative for chills, fever and malaise/fatigue.  Respiratory:  Negative for cough, shortness of breath and wheezing.   Cardiovascular:  Negative for chest pain, palpitations and leg swelling.  Neurological:  Negative for dizziness, tingling, weakness and headaches.  Psychiatric/Behavioral:  Negative for depression and suicidal ideas. The patient is not nervous/anxious and does not have insomnia.      Objective:     BP (!) 143/84   Pulse 78   SpO2 99%    Physical Exam Vitals and nursing note reviewed.  Constitutional:      General: He is not in acute distress.    Appearance: Normal appearance.  HENT:     Head: Normocephalic and atraumatic.  Cardiovascular:     Rate and Rhythm: Normal rate and regular rhythm.     Pulses: Normal pulses.     Heart sounds: Normal heart sounds. No murmur heard.   No friction rub. No gallop.  Pulmonary:     Effort: Pulmonary effort is normal. No respiratory distress.     Breath sounds: Normal breath sounds.  Musculoskeletal:        General: Tenderness (bilateral lower paraspinal muscles)  present.  Skin:    General: Skin is warm and dry.  Neurological:     Mental Status: He is alert and oriented to person, place, and time.  Psychiatric:        Mood and Affect: Mood normal.        Behavior: Behavior normal.        Thought Content: Thought content normal.        Judgment: Judgment normal.   No results found for any visits on 05/16/22.    The 10-year ASCVD risk score (Arnett DK, et al., 2019) is: 40.7%    Assessment & Plan:   Problem List Items Addressed This Visit       Cardiovascular and Mediastinum   Hypertension - Primary    BP elevated today but home readings have been fairly good and at goal. Pain is definitely a consideration today. For now, continue medications as prescribed. Monitor BP at home with a goal of 130/80 or less. Low sodium diet and regular intentional exercise is recommended.          Other   Acute bilateral low back pain without sciatica    Declined oral steroids. Depo-Medrol 80mg  IM x 1 in office. Does not tolerate oral NSAIDs per patient report. Toradol 60mg  IM x 1 in office. Flexeril 10mg  TID prn, advised to avoid driving until he knows how the medications affect him. Small quantity of Tramadol 50mg  every 8 hours prn. Exercises provided for home.  If in 4-6 weeks no improvement, return for further evaluation with me or Dr. Karie Schwalbe.        Relevant Medications   cyclobenzaprine (FLEXERIL) 10 MG tablet   traMADol (ULTRAM) 50 MG tablet   Return in about 3 months (around 08/16/2022) for HTN follow up.  ___________________________________________ Thayer Ohm, DNP, APRN, FNP-BC Primary Care and Sports Medicine [facility] [place]

## 2022-05-19 DIAGNOSIS — M25552 Pain in left hip: Secondary | ICD-10-CM | POA: Diagnosis not present

## 2022-05-19 DIAGNOSIS — Z471 Aftercare following joint replacement surgery: Secondary | ICD-10-CM | POA: Diagnosis not present

## 2022-05-19 DIAGNOSIS — Z96642 Presence of left artificial hip joint: Secondary | ICD-10-CM | POA: Diagnosis not present

## 2022-05-20 ENCOUNTER — Telehealth: Payer: Self-pay

## 2022-05-20 NOTE — Telephone Encounter (Signed)
Initiated Prior authorization [id number] (AMBIEN) 10 MG tablet Via: Covermymeds Case/Key: [id number] Status: approved  as of 05/20/22 Reason:The authorization is valid from 04/20/2022 through 05/20/2023 Notified Pt via: Mychart

## 2022-05-29 ENCOUNTER — Encounter: Payer: Self-pay | Admitting: Medical-Surgical

## 2022-06-01 ENCOUNTER — Ambulatory Visit (INDEPENDENT_AMBULATORY_CARE_PROVIDER_SITE_OTHER): Payer: Medicare Other | Admitting: Medical-Surgical

## 2022-06-01 ENCOUNTER — Encounter: Payer: Self-pay | Admitting: Medical-Surgical

## 2022-06-01 VITALS — BP 133/84 | HR 76 | Resp 20 | Ht 70.5 in | Wt 192.1 lb

## 2022-06-01 DIAGNOSIS — L723 Sebaceous cyst: Secondary | ICD-10-CM

## 2022-06-01 NOTE — Progress Notes (Signed)
   Established Patient Office Visit  Subjective   Patient ID: Richard Fuller, male   DOB: 07-04-1956 Age: 66 y.o. MRN: [id number]   Chief Complaint  Patient presents with   Procedure    HPI Pleasant 66 year old male presenting today for removal of a small cyst that has been on his right thigh for 1 year.  Denies redness, pain, or swelling to the area but this is bothersome and he would like to have it removed.   Objective:    Vitals:   06/01/22 1022  BP: 133/84  Pulse: 76  Resp: 20  Height: 5' 10.5" (1.791 m)  Weight: 192 lb 1.9 oz (87.1 kg)  SpO2: 97%  BMI (Calculated): 27.17   Physical Exam Vitals reviewed.  Constitutional:      General: He is not in acute distress.    Appearance: Normal appearance. He is obese. He is not ill-appearing.  HENT:     Head: Normocephalic and atraumatic.  Cardiovascular:     Rate and Rhythm: Normal rate.  Pulmonary:     Effort: Pulmonary effort is normal. No respiratory distress.  Skin:    General: Skin is warm and dry.       Neurological:     Mental Status: He is alert and oriented to person, place, and time.  Psychiatric:        Mood and Affect: Mood normal.        Behavior: Behavior normal.        Thought Content: Thought content normal.        Judgment: Judgment normal.   No results found for this or any previous visit (from the past 24 hour(s)).     The 10-year ASCVD risk score (Arnett DK, et al., 2019) is: 36.8%   Values used to calculate the score:     Age: 13 years     Sex: Male     Is Non-Hispanic African American: No     Diabetic: Yes     Tobacco smoker: Yes     Systolic Blood Pressure: [id number] mmHg     Is BP treated: Yes     HDL Cholesterol: 40 mg/dL     Total Cholesterol: 163 mg/dL   Assessment & Plan:   1. Sebaceous cyst Procedure:  Excision of right upper thigh sebaceous cyst Risks, benefits, and alternatives explained and consent obtained. Time out conducted. Surface prepped with chlorhexidine. 5cc lidocaine  with epinephine infiltrated in a field block. Adequate anesthesia ensured. Area prepped and draped in a sterile fashion. Excision performed via linear midline incision. Sack loosened from surrounding tissue. Cyst ruptured with thick gray to white contents expressed. Entirety of sac removed. Incision closed with non-absorbable sutures x 3, edges well approximated.  Hemostasis achieved. Triple antibiotic ointment applied, covered with large sterile band-aid. Pt stable.  Reviewed wound care instructions and s/s of infection. Advised to contact our office for questions or concerns. Return in 1 week for evaluation and suture removal.   Return in about 1 week (around 06/08/2022) for suture removal/mobility review.  ___________________________________________ Clearnce Sorrel, DNP, APRN, FNP-BC Primary Care and Sports Medicine [place]

## 2022-06-10 ENCOUNTER — Encounter: Payer: Self-pay | Admitting: Medical-Surgical

## 2022-06-10 ENCOUNTER — Ambulatory Visit (INDEPENDENT_AMBULATORY_CARE_PROVIDER_SITE_OTHER): Payer: Medicare Other | Admitting: Medical-Surgical

## 2022-06-10 VITALS — BP 176/99 | HR 78 | Temp 99.0°F | Ht 75.0 in | Wt 189.0 lb

## 2022-06-10 DIAGNOSIS — Z4802 Encounter for removal of sutures: Secondary | ICD-10-CM

## 2022-06-10 DIAGNOSIS — I10 Essential (primary) hypertension: Secondary | ICD-10-CM | POA: Diagnosis not present

## 2022-06-10 NOTE — Progress Notes (Signed)
   Established Patient Office Visit  Subjective   Patient ID: Richard Fuller, male   DOB: 1956-11-01 Age: 66 y.o. MRN: [id number]   Chief Complaint  Patient presents with   Suture / Staple Removal    (3) removed    HPI Pleasant 66 year old male presenting today to have sutures removed after the removal of a sebaceous cyst 1 week ago.  Patient reports that the wound has been healing well with no bleeding, erythema, tenderness, or edema.  Denies fevers, chills, and myalgias.  Notes that he forgot to take his blood pressure medication this morning but will take them as soon as he gets home.   Objective:    Vitals:   06/10/22 1108 06/10/22 1110  BP: (!) 155/100 (!) 176/99  Pulse: 76 78  Temp: 99 F (37.2 C)   Height: 6\' 3"  (1.905 m)   Weight: 189 lb 0.6 oz (85.7 kg)   SpO2: 97%   TempSrc: Oral   BMI (Calculated): 23.63    Physical Exam Vitals reviewed.  Constitutional:      General: He is not in acute distress.    Appearance: Normal appearance. He is obese. He is not ill-appearing.  HENT:     Head: Normocephalic.  Cardiovascular:     Rate and Rhythm: Normal rate.     Pulses: Normal pulses.     Heart sounds: Normal heart sounds. No murmur heard.    No friction rub. No gallop.  Pulmonary:     Effort: Pulmonary effort is normal. No respiratory distress.     Breath sounds: Normal breath sounds.  Skin:    General: Skin is warm and dry.       Neurological:     Mental Status: He is alert and oriented to person, place, and time.  Psychiatric:        Mood and Affect: Mood normal.        Behavior: Behavior normal.        Thought Content: Thought content normal.        Judgment: Judgment normal.   No results found for this or any previous visit (from the past 24 hour(s)).     The 10-year ASCVD risk score (Arnett DK, et al., 2019) is: 53.2%   Values used to calculate the score:     Age: 78 years     Sex: Male     Is Non-Hispanic African American: No     Diabetic: Yes      Tobacco smoker: Yes     Systolic Blood Pressure: 176 mmHg     Is BP treated: Yes     HDL Cholesterol: 40 mg/dL     Total Cholesterol: 163 mg/dL   Assessment & Plan:   1. Visit for suture removal 3 sutures successfully removed from the right thigh cyst removal site. Antibiotic ointment applied and covered with a bandaid. Post suture removal care reviewed. Monitor for s/s infection.   2. Primary hypertension Advised to go home and take medications right away. Recheck BP later to make sure it's coming down. Avoid added sodium to foods and processed foods.   Return for follow up as scheduled in August.  ___________________________________________ September, DNP, APRN, FNP-BC Primary Care and Sports Medicine [facility] [place]

## 2022-06-29 ENCOUNTER — Other Ambulatory Visit: Payer: Self-pay

## 2022-06-29 MED ORDER — ZOLPIDEM TARTRATE 10 MG PO TABS: 10.0000 mg | ORAL_TABLET | Freq: Every evening | ORAL | 0 refills | Status: DC | PRN

## 2022-06-29 NOTE — Progress Notes (Signed)
Last Office Visit 06/10/2022  Last filled 05/02/2022

## 2022-07-27 ENCOUNTER — Other Ambulatory Visit: Payer: Self-pay | Admitting: Medical-Surgical

## 2022-07-27 ENCOUNTER — Ambulatory Visit (INDEPENDENT_AMBULATORY_CARE_PROVIDER_SITE_OTHER): Payer: Federal, State, Local not specified - PPO | Admitting: Medical-Surgical

## 2022-07-27 DIAGNOSIS — Z0000 Encounter for general adult medical examination without abnormal findings: Secondary | ICD-10-CM | POA: Diagnosis not present

## 2022-07-27 MED ORDER — ZOLPIDEM TARTRATE 10 MG PO TABS: 10.0000 mg | ORAL_TABLET | Freq: Every evening | ORAL | 0 refills | Status: DC | PRN

## 2022-07-27 NOTE — Progress Notes (Signed)
MEDICARE ANNUAL WELLNESS VISIT  07/27/2022  Telephone Visit Disclaimer This Medicare AWV was conducted by telephone due to national recommendations for restrictions regarding the COVID-19 Pandemic (e.g. social distancing).  I verified, using two identifiers, that I am speaking with Richard Fuller or their authorized healthcare agent. I discussed the limitations, risks, security, and privacy concerns of performing an evaluation and management service by telephone and the potential availability of an in-person appointment in the future. The patient expressed understanding and agreed to proceed.  Location of Patient: Home Location of Provider (nurse):  Provider home  Subjective:    Richard Fuller is a 66 y.o. male patient of Christen Butter, NP who had a Medicare Annual Wellness Visit today via telephone. Worthy is Disabled and lives with their spouse. he has 7 children. he reports that he is socially active and does interact with friends/family regularly. he is moderately physically active and enjoys lifting weights.  Patient Care Team: Christen Butter, NP as PCP - General (Nurse Practitioner)     07/27/2022   10:12 AM  Advanced Directives  Does Patient Have a Medical Advance Directive? No  Would patient like information on creating a medical advance directive? No - Patient declined    Hospital Utilization Over the Past 12 Months: # of hospitalizations or ER visits: 1 # of surgeries: 1  Review of Systems    Patient reports that his overall health is better compared to last year.  History obtained from chart review and the patient  Patient Reported Readings (BP, Pulse, CBG, Weight, etc) none  Pain Assessment Pain : No/denies pain     Current Medications & Allergies (verified) Allergies as of 07/27/2022       Reactions   Codeine    Headache        Medication List        Accurate as of July 27, 2022 10:21 AM. If you have any questions, ask your nurse or doctor.           amLODipine 5 MG tablet Commonly known as: NORVASC Take 1 tablet (5 mg total) by mouth daily.   Aspirin Low Dose 81 MG tablet Generic drug: aspirin EC Take 1 tablet (81 mg total) by mouth daily.   carvedilol 25 MG tablet Commonly known as: COREG Take 1 tablet (25 mg total) by mouth daily as needed.   cyclobenzaprine 10 MG tablet Commonly known as: FLEXERIL Take 1 tablet (10 mg total) by mouth 3 (three) times daily as needed for muscle spasms.   enalapril 20 MG tablet Commonly known as: VASOTEC Take 1 tablet (20 mg total) by mouth 2 (two) times daily.   escitalopram 10 MG tablet Commonly known as: LEXAPRO Take 1 tablet (10 mg total) by mouth daily.   fenofibrate 48 MG tablet Commonly known as: TRICOR Take 1 tablet (48 mg total) by mouth daily.   gabapentin 300 MG capsule Commonly known as: NEURONTIN One tab PO qHS for a week, then BID for a week, then TID. May double weekly to a max of 3,600mg /day   metFORMIN 500 MG tablet Commonly known as: GLUCOPHAGE Take 1 tablet (500 mg total) by mouth 2 (two) times daily.   pantoprazole 40 MG tablet Commonly known as: PROTONIX Take 1 tablet (40 mg total) by mouth daily.   pravastatin 20 MG tablet Commonly known as: PRAVACHOL TAKE 1 TABLET(10 MG) BY MOUTH DAILY   zolpidem 10 MG tablet Commonly known as: AMBIEN Take 1 tablet (10 mg total) by mouth  at bedtime as needed.        History (reviewed): Past Medical History:  Diagnosis Date   Allergy 1985   Anemia 2015   Anxiety    Arthritis 2000   Atrial fibrillation (HCC)    Depression    Diabetes mellitus without complication (HCC)    GERD (gastroesophageal reflux disease) 2004   Heart murmur 1996   Hyperlipidemia    Hypertension    Osteoporosis 1996   Past Surgical History:  Procedure Laterality Date   CHOLECYSTECTOMY     FOOT SURGERY     FRACTURE SURGERY  1996   HERNIA REPAIR     JOINT REPLACEMENT     Had 5 hip surgery   KNEE SURGERY     SPINE SURGERY   2016   Family History  Problem Relation Age of Onset   Diabetes Mother    Hypertension Mother    Breast cancer Mother    Cancer Mother    Diabetes Brother    Hypertension Brother    Hypertension Maternal Grandfather    Diabetes Maternal Grandfather    Arthritis Father    Social History   Socioeconomic History   Marital status: Married    Spouse name: Rhoderick Farrel   Number of children: 7   Years of education: 14   Highest education level: Associate degree: occupational, Scientist, product/process development, or vocational program  Occupational History   Occupation: Disabled  Tobacco Use   Smoking status: Every Day    Packs/day: 0.50    Years: 53.00    Total pack years: 26.50    Types: Cigarettes   Smokeless tobacco: Never  Substance and Sexual Activity   Alcohol use: Not Currently   Drug use: Yes    Types: Marijuana    Comment: 1-2 uses per week   Sexual activity: Yes    Partners: Female  Other Topics Concern   Not on file  Social History Narrative   Lives with his spouse. He has 7 children and two step children. 34 grandchildren. 6 great grandchildren. He enjoys lifting weights.   Social Determinants of Health   Financial Resource Strain: Medium Risk (07/23/2022)   Overall Financial Resource Strain (CARDIA)    Difficulty of Paying Living Expenses: Somewhat hard  Food Insecurity: Food Insecurity Present (07/23/2022)   Hunger Vital Sign    Worried About Running Out of Food in the Last Year: Often true    Ran Out of Food in the Last Year: Often true  Transportation Needs: No Transportation Needs (07/23/2022)   PRAPARE - Administrator, Civil Service (Medical): No    Lack of Transportation (Non-Medical): No  Physical Activity: Insufficiently Active (07/23/2022)   Exercise Vital Sign    Days of Exercise per Week: 3 days    Minutes of Exercise per Session: 30 min  Stress: No Stress Concern Present (07/23/2022)   [employer] of Occupational Health - Occupational Stress  Questionnaire    Feeling of Stress : Only a little  Social Connections: Socially Isolated (07/27/2022)   Social Connection and Isolation Panel [NHANES]    Frequency of Communication with Friends and Family: Twice a week    Frequency of Social Gatherings with Friends and Family: Never    Attends Religious Services: Never    Database administrator or Organizations: No    Attends Banker Meetings: Never    Marital Status: Married    Activities of Daily Living    07/27/2022   10:08 AM  07/23/2022    9:47 AM  In your present state of health, do you have any difficulty performing the following activities:  Hearing? 1   Comment some hearing loss in his left ear.   Vision? 1 1  Comment he currently has a stye; but overall is having some difficulty seeing. he has an eye appt at the end of the month.   Difficulty concentrating or making decisions? 0 0  Walking or climbing stairs? 1 1  Comment due to his hip surgeries; he is not able to climb stairs.   Dressing or bathing? 0 0  Doing errands, shopping? 0 0  Preparing Food and eating ? N N  Using the Toilet? N N  In the past six months, have you accidently leaked urine? Y Y  Comment urgency   Do you have problems with loss of bowel control? Malvin Johns  Comment urgency   Managing your Medications? N N  Managing your Finances? N N  Housekeeping or managing your Housekeeping? N N    Patient Education/ Literacy How often do you need to have someone help you when you read instructions, pamphlets, or other written materials from your doctor or pharmacy?: 1 - Never What is the last grade level you completed in school?: Technical degree  Exercise Current Exercise Habits: Home exercise routine, Type of exercise: strength training/weights, Time (Minutes): 30, Frequency (Times/Week): 3, Weekly Exercise (Minutes/Week): 90, Intensity: Moderate, Exercise limited by: orthopedic condition(s)  Diet Patient reports consuming  2-3  meals a day and 1  snack(s) a day Patient reports that his primary diet is: Regular Patient reports that she does have regular access to food.   Depression Screen    07/27/2022   10:05 AM 06/01/2022   10:31 AM 05/02/2022    8:52 AM 11/02/2021    4:00 PM  PHQ 2/9 Scores  PHQ - 2 Score 0 0 0 0  PHQ- 9 Score    0     Fall Risk    07/27/2022   10:05 AM 07/23/2022    9:47 AM 06/01/2022   10:31 AM 05/02/2022    8:52 AM 11/02/2021    4:00 PM  Fall Risk   Falls in the past year? 1 1 0 0 1  Number falls in past yr: 0 0 0 0 0  Injury with Fall? 0 0 0 0 0  Risk for fall due to : Impaired mobility;Impaired balance/gait;History of fall(s)  No Fall Risks No Fall Risks   Follow up Falls evaluation completed;Education provided;Falls prevention discussed  Falls evaluation completed Falls evaluation completed Falls evaluation completed     Objective:  Richard Fuller seemed alert and oriented and he participated appropriately during our telephone visit.  Blood Pressure Weight BMI  BP Readings from Last 3 Encounters:  06/10/22 (!) 176/99  06/01/22 133/84  05/16/22 (!) 143/84   Wt Readings from Last 3 Encounters:  06/10/22 189 lb 0.6 oz (85.7 kg)  06/01/22 192 lb 1.9 oz (87.1 kg)  05/02/22 192 lb 1.9 oz (87.1 kg)   BMI Readings from Last 1 Encounters:  06/10/22 23.63 kg/m    *Unable to obtain current vital signs, weight, and BMI due to telephone visit type  Hearing/Vision  Richard Fuller did not seem to have difficulty with hearing/understanding during the telephone conversation Reports that he is unsure if he had a formal eye exam by an eye care professional within the past year Reports that he has not had a formal hearing evaluation within the past  year *Unable to fully assess hearing and vision during telephone visit type  Cognitive Function:    07/27/2022   10:18 AM  6CIT Screen  What Year? 0 points  What month? 0 points  What time? 0 points  Count back from 20 0 points  Months in reverse 0 points  Repeat phrase 2  points  Total Score 2 points   (Normal:0-7, Significant for Dysfunction: >8)  Normal Cognitive Function Screening: Yes   Immunization & Health Maintenance Record Immunization History  Administered Date(s) Administered   Influenza Split 10/18/2013, 09/07/2015, 09/15/2018   Influenza, Seasonal, Injecte, Preservative Fre 11/13/2014   Influenza,inj,Quad PF,6+ Mos 11/05/2020   Influenza,inj,Quad PF,6-35 Mos 10/18/2013, 09/07/2015   Influenza-Unspecified 11/13/2014, 10/19/2021   PFIZER(Purple Top)SARS-COV-2 Vaccination 04/27/2020, 05/18/2020   Pneumococcal Polysaccharide-23 04/21/2014   Tdap 11/03/2015    Health Maintenance  Topic Date Due   FOOT EXAM  Never done   OPHTHALMOLOGY EXAM  Never done   Diabetic kidney evaluation - Urine ACR  Never done   COLONOSCOPY (Pts 45-7542yrs Insurance coverage will need to be confirmed)  Never done   Zoster Vaccines- Shingrix (1 of 2) Never done   Pneumonia Vaccine 465+ Years old (2 - PCV) 04/22/2015   COVID-19 Vaccine (3 - Pfizer series) 07/13/2020   INFLUENZA VACCINE  07/26/2022   HEMOGLOBIN A1C  11/02/2022   Diabetic kidney evaluation - GFR measurement  05/03/2023   TETANUS/TDAP  11/02/2025   Hepatitis C Screening  Completed   HIV Screening  Completed   HPV VACCINES  Aged Out       Assessment  This is a routine wellness examination for American Financialerry Fuller.  Health Maintenance: Due or Overdue Health Maintenance Due  Topic Date Due   FOOT EXAM  Never done   OPHTHALMOLOGY EXAM  Never done   Diabetic kidney evaluation - Urine ACR  Never done   COLONOSCOPY (Pts 45-2342yrs Insurance coverage will need to be confirmed)  Never done   Zoster Vaccines- Shingrix (1 of 2) Never done   Pneumonia Vaccine 7265+ Years old (2 - PCV) 04/22/2015   COVID-19 Vaccine (3 - Pfizer series) 07/13/2020   INFLUENZA VACCINE  07/26/2022    Richard Purlerry Lubas does not need a referral for Community Assistance: Care Management:   no Social Work:    no Prescription  Assistance:  no Nutrition/Diabetes Education:  no   Plan:  Personalized Goals  Goals Addressed               This Visit's Progress     Patient Stated (pt-stated)        07/27/2022 AWV Goal: Tobacco Cessation  Smoking cessation instruction/counseling given:  counseled patient on the dangers of tobacco use, advised patient to stop smoking, and reviewed strategies to maximize success  Patient will verbalize understanding of the health risks associated with smoking/tobacco use Lung cancer or lung disease, such as COPD Heart disease. Stroke. Heart attack Infertility Osteoporosis and bone fractures. Patient will create a plan to quit smoking/using tobacco Pick a date to quit.  Write down the reasons why you are quitting and put it where you will see it often. Identify the people, places, things, and activities that make you want to smoke (triggers) and avoid them. Make sure to take these actions: Throw away all cigarettes at home, at work, and in your car. Throw away smoking accessories, such as Set designerashtrays and lighters. Clean your car and make sure to empty the ashtray. Clean your home, including curtains and carpets. Tell  your family, friends, and coworkers that you are quitting. Support from your loved ones can make quitting easier. Talk with your health care provider about your options for quitting smoking. Find out what treatment options are covered by your health insurance. Patient will be able to demonstrate knowledge of tobacco cessation strategies that may maximize success Quitting "cold [place]" is more successful than gradually quitting. Attending in-person counseling to help you build problem-solving skills.  Finding resources and support systems that can help you to quit smoking and remain smoke-free after you quit. These resources are most helpful when you use them often. They can include: Online chats with a Veterinary surgeon. Telephone quitlines. Printed Chief Executive Officer. Support groups or group counseling. Text messaging programs. Mobile phone applications. Taking medicines to help you quit smoking: Nicotine patches, gum, or lozenges. Nicotine inhalers or sprays. Non-nicotine medicine that is taken by mouth. Patient will note get discouraged if the process is difficult Over the next year, patient will stop smoking or using other forms of tobacco  Smoking cessation instruction/counseling given:  counseled patient on the dangers of tobacco use, advised patient to stop smoking, and reviewed strategies to maximize success         Personalized Health Maintenance & Screening Recommendations  Pneumonia vaccine Influenza vaccine Eye exam Urine ACR Shingrix vaccine Flu shot  Lung Cancer Screening Recommended: yes (Low Dose CT Chest recommended if Age 70-80 years, 30 pack-year currently smoking OR have quit w/in past 15 years) Hepatitis C Screening recommended: no HIV Screening recommended: no  Advanced Directives: Written information was not prepared per patient's request.  Referrals & Orders No orders of the defined types were placed in this encounter.   Follow-up Plan Follow-up with Christen Butter, NP as planned Schedule your Shingrix vaccine at your pharmacy.  Foot exam, Urine ACR, Pneumonia vaccine and influenza vaccine can be done at your next visit.  Please schedule your eye exam. Medicare wellness visit in one year.  Patient will access AVS on my chart.   I have personally reviewed and noted the following in the patient's chart:   Medical and social history Use of alcohol, tobacco or illicit drugs  Current medications and supplements Functional ability and status Nutritional status Physical activity Advanced directives List of other physicians Hospitalizations, surgeries, and ER visits in previous 12 months Vitals Screenings to include cognitive, depression, and falls Referrals and appointments  In addition, I have  reviewed and discussed with Richard Fuller certain preventive protocols, quality metrics, and best practice recommendations. A written personalized care plan for preventive services as well as general preventive health recommendations is available and can be mailed to the patient at his request.      Modesto Charon, RN BSN  07/27/2022

## 2022-07-27 NOTE — Progress Notes (Signed)
Patient requested a refill of Ambien while speaking with the [facility] Wellness nurse. Refill sent. Follow up as scheduled.  ___________________________________________ Thayer Ohm, DNP, APRN, FNP-BC Primary Care and Sports Medicine [facility]

## 2022-07-27 NOTE — Patient Instructions (Addendum)
MEDICARE ANNUAL WELLNESS VISIT Health Maintenance Summary and Written Plan of Care  Mr. Richard Fuller ,  Thank you for allowing me to perform your Medicare Annual Wellness Visit and for your ongoing commitment to your health.   Health Maintenance & Immunization History Health Maintenance  Topic Date Due  . OPHTHALMOLOGY EXAM  07/27/2022 (Originally 02/01/1966)  . FOOT EXAM  07/28/2022 (Originally 02/01/1966)  . Diabetic kidney evaluation - Urine ACR  07/29/2022 (Originally 02/01/1975)  . COVID-19 Vaccine (3 - Pfizer series) 08/12/2022 (Originally 07/13/2020)  . Zoster Vaccines- Shingrix (1 of 2) 10/27/2022 (Originally 02/01/2007)  . INFLUENZA VACCINE  03/26/2023 (Originally 07/26/2022)  . Pneumonia Vaccine 51+ Years old (2 - PCV) 07/28/2023 (Originally 04/22/2015)  . HEMOGLOBIN A1C  11/02/2022  . Diabetic kidney evaluation - GFR measurement  05/03/2023  . COLONOSCOPY (Pts 45-70yrs Insurance coverage will need to be confirmed)  05/04/2023  . TETANUS/TDAP  11/02/2025  . Hepatitis C Screening  Completed  . HIV Screening  Completed  . HPV VACCINES  Aged Out   Immunization History  Administered Date(s) Administered  . Influenza Split 10/18/2013, 09/07/2015, 09/15/2018  . Influenza, Seasonal, Injecte, Preservative Fre 11/13/2014  . Influenza,inj,Quad PF,6+ Mos 11/05/2020  . Influenza,inj,Quad PF,6-35 Mos 10/18/2013, 09/07/2015  . Influenza-Unspecified 11/13/2014, 10/19/2021  . PFIZER(Purple Top)SARS-COV-2 Vaccination 04/27/2020, 05/18/2020  . Pneumococcal Polysaccharide-23 04/21/2014  . Tdap 11/03/2015    These are the patient goals that we discussed:  Goals Addressed              This Visit's Progress   .  Patient Stated (pt-stated)        07/27/2022 AWV Goal: Tobacco Cessation  Smoking cessation instruction/counseling given:  counseled patient on the dangers of tobacco use, advised patient to stop smoking, and reviewed strategies to maximize success  Patient will verbalize understanding of  the health risks associated with smoking/tobacco use Lung cancer or lung disease, such as COPD Heart disease. Stroke. Heart attack Infertility Osteoporosis and bone fractures. Patient will create a plan to quit smoking/using tobacco Pick a date to quit.  Write down the reasons why you are quitting and put it where you will see it often. Identify the people, places, things, and activities that make you want to smoke (triggers) and avoid them. Make sure to take these actions: Throw away all cigarettes at home, at work, and in your car. Throw away smoking accessories, such as Set designer. Clean your car and make sure to empty the ashtray. Clean your home, including curtains and carpets. Tell your family, friends, and coworkers that you are quitting. Support from your loved ones can make quitting easier. Talk with your health care provider about your options for quitting smoking. Find out what treatment options are covered by your health insurance. Patient will be able to demonstrate knowledge of tobacco cessation strategies that may maximize success Quitting "cold [place]" is more successful than gradually quitting. Attending in-person counseling to help you build problem-solving skills.  Finding resources and support systems that can help you to quit smoking and remain smoke-free after you quit. These resources are most helpful when you use them often. They can include: Online chats with a Veterinary surgeon. Telephone quitlines. Printed Materials engineer. Support groups or group counseling. Text messaging programs. Mobile phone applications. Taking medicines to help you quit smoking: Nicotine patches, gum, or lozenges. Nicotine inhalers or sprays. Non-nicotine medicine that is taken by mouth. Patient will note get discouraged if the process is difficult Over the next year, patient will stop  smoking or using other forms of tobacco  Smoking cessation instruction/counseling given:   counseled patient on the dangers of tobacco use, advised patient to stop smoking, and reviewed strategies to maximize success          This is a list of Health Maintenance Items that are overdue or due now: Pneumonia vaccine Influenza vaccine Eye exam Urine ACR Shingrix vaccine Flu shot  Orders/Referrals Placed Today: No orders of the defined types were placed in this encounter.  (Contact our referral department at [phone] if you have not spoken with someone about your referral appointment within the next 5 days)    Follow-up Plan Follow-up with Christen Butter, NP as planned Schedule your Shingrix vaccine at your pharmacy.  Foot exam, Urine ACR, Pneumonia vaccine and influenza vaccine can be done at your next visit.  Please schedule your eye exam. Medicare wellness visit in one year.  Patient will access AVS on my chart.      Diabetes Mellitus and Foot Care Foot care is an important part of your health, especially when you have diabetes. Diabetes may cause you to have problems because of poor blood flow (circulation) to your feet and legs, which can cause your skin to: Become thinner and drier. Break more easily. Heal more slowly. Peel and crack. You may also have nerve damage (neuropathy) in your legs and feet, causing decreased feeling in them. This means that you may not notice minor injuries to your feet that could lead to more serious problems. Noticing and addressing any potential problems early is the best way to prevent future foot problems. How to care for your feet Foot hygiene  Wash your feet daily with warm water and mild soap. Do not use hot water. Then, pat your feet and the areas between your toes until they are completely dry. Do not soak your feet as this can dry your skin. Trim your toenails straight across. Do not dig under them or around the cuticle. File the edges of your nails with an emery board or nail file. Apply a moisturizing lotion or petroleum  jelly to the skin on your feet and to dry, brittle toenails. Use lotion that does not contain alcohol and is unscented. Do not apply lotion between your toes. Shoes and socks Wear clean socks or stockings every day. Make sure they are not too tight. Do not wear knee-high stockings since they may decrease blood flow to your legs. Wear shoes that fit properly and have enough cushioning. Always look in your shoes before you put them on to be sure there are no objects inside. To break in new shoes, wear them for just a few hours a day. This prevents injuries on your feet. Wounds, scrapes, corns, and calluses  Check your feet daily for blisters, cuts, bruises, sores, and redness. If you cannot see the bottom of your feet, use a mirror or ask someone for help. Do not cut corns or calluses or try to remove them with medicine. If you find a minor scrape, cut, or break in the skin on your feet, keep it and the skin around it clean and dry. You may clean these areas with mild soap and water. Do not clean the area with peroxide, alcohol, or iodine. If you have a wound, scrape, corn, or callus on your foot, look at it several times a day to make sure it is healing and not infected. Check for: Redness, swelling, or pain. Fluid or blood. Warmth. Pus or a bad  smell. General tips Do not cross your legs. This may decrease blood flow to your feet. Do not use heating pads or hot water bottles on your feet. They may burn your skin. If you have lost feeling in your feet or legs, you may not know this is happening until it is too late. Protect your feet from hot and cold by wearing shoes, such as at the beach or on hot pavement. Schedule a complete foot exam at least once a year (annually) or more often if you have foot problems. Report any cuts, sores, or bruises to your health care provider immediately. Where to find more information American Diabetes Association: www.diabetes.org Association of Diabetes Care &  Education Specialists: www.diabeteseducator.org Contact a health care provider if: You have a medical condition that increases your risk of infection and you have any cuts, sores, or bruises on your feet. You have an injury that is not healing. You have redness on your legs or feet. You feel burning or tingling in your legs or feet. You have pain or cramps in your legs and feet. Your legs or feet are numb. Your feet always feel cold. You have pain around any toenails. Get help right away if: You have a wound, scrape, corn, or callus on your foot and: You have pain, swelling, or redness that gets worse. You have fluid or blood coming from the wound, scrape, corn, or callus. Your wound, scrape, corn, or callus feels warm to the touch. You have pus or a bad smell coming from the wound, scrape, corn, or callus. You have a fever. You have a red line going up your leg. Summary Check your feet every day for blisters, cuts, bruises, sores, and redness. Apply a moisturizing lotion or petroleum jelly to the skin on your feet and to dry, brittle toenails. Wear shoes that fit properly and have enough cushioning. If you have foot problems, report any cuts, sores, or bruises to your health care provider immediately. Schedule a complete foot exam at least once a year (annually) or more often if you have foot problems. This information is not intended to replace advice given to you by your health care provider. Make sure you discuss any questions you have with your health care provider. Document Revised: 07/02/2020 Document Reviewed: 07/02/2020 Elsevier Patient Education  2023 Elsevier Inc.  Health Maintenance, Male Adopting a healthy lifestyle and getting preventive care are important in promoting health and wellness. Ask your health care provider about: The right schedule for you to have regular tests and exams. Things you can do on your own to prevent diseases and keep yourself healthy. What  should I know about diet, weight, and exercise? Eat a healthy diet  Eat a diet that includes plenty of vegetables, fruits, low-fat dairy products, and lean protein. Do not eat a lot of foods that are high in solid fats, added sugars, or sodium. Maintain a healthy weight Body mass index (BMI) is a measurement that can be used to identify possible weight problems. It estimates body fat based on height and weight. Your health care provider can help determine your BMI and help you achieve or maintain a healthy weight. Get regular exercise Get regular exercise. This is one of the most important things you can do for your health. Most adults should: Exercise for at least 150 minutes each week. The exercise should increase your heart rate and make you sweat (moderate-intensity exercise). Do strengthening exercises at least twice a week. This is in addition  to the moderate-intensity exercise. Spend less time sitting. Even light physical activity can be beneficial. Watch cholesterol and blood lipids Have your blood tested for lipids and cholesterol at 65 years of age, then have this test every 5 years. You may need to have your cholesterol levels checked more often if: Your lipid or cholesterol levels are high. You are older than 65 years of age. You are at high risk for heart disease. What should I know about cancer screening? Many types of cancers can be detected early and may often be prevented. Depending on your health history and family history, you may need to have cancer screening at various ages. This may include screening for: Colorectal cancer. Prostate cancer. Skin cancer. Lung cancer. What should I know about heart disease, diabetes, and high blood pressure? Blood pressure and heart disease High blood pressure causes heart disease and increases the risk of stroke. This is more likely to develop in people who have high blood pressure readings or are overweight. Talk with your health care  provider about your target blood pressure readings. Have your blood pressure checked: Every 3-5 years if you are 65-82 years of age. Every year if you are 31 years old or older. If you are between the ages of 35 and 12 and are a current or former smoker, ask your health care provider if you should have a one-time screening for abdominal aortic aneurysm (AAA). Diabetes Have regular diabetes screenings. This checks your fasting blood sugar level. Have the screening done: Once every three years after age 60 if you are at a normal weight and have a low risk for diabetes. More often and at a younger age if you are overweight or have a high risk for diabetes. What should I know about preventing infection? Hepatitis B If you have a higher risk for hepatitis B, you should be screened for this virus. Talk with your health care provider to find out if you are at risk for hepatitis B infection. Hepatitis C Blood testing is recommended for: Everyone born from [birth year] through 1964. Anyone with known risk factors for hepatitis C. Sexually transmitted infections (STIs) You should be screened each year for STIs, including gonorrhea and chlamydia, if: You are sexually active and are younger than 65 years of age. You are older than 65 years of age and your health care provider tells you that you are at risk for this type of infection. Your sexual activity has changed since you were last screened, and you are at increased risk for chlamydia or gonorrhea. Ask your health care provider if you are at risk. Ask your health care provider about whether you are at high risk for HIV. Your health care provider may recommend a prescription medicine to help prevent HIV infection. If you choose to take medicine to prevent HIV, you should first get tested for HIV. You should then be tested every 3 months for as long as you are taking the medicine. Follow these instructions at home: Alcohol use Do not drink alcohol if your  health care provider tells you not to drink. If you drink alcohol: Limit how much you have to 0-2 drinks a day. Know how much alcohol is in your drink. In the [place] one drink equals one 12 oz bottle of beer (355 mL), one 5 oz glass of wine (148 mL), or one 1 oz glass of hard liquor (44 mL). Lifestyle Do not use any products that contain nicotine or tobacco. These products include cigarettes, chewing  tobacco, and vaping devices, such as e-cigarettes. If you need help quitting, ask your health care provider. Do not use street drugs. Do not share needles. Ask your health care provider for help if you need support or information about quitting drugs. General instructions Schedule regular health, dental, and eye exams. Stay current with your vaccines. Tell your health care provider if: You often feel depressed. You have ever been abused or do not feel safe at home. Summary Adopting a healthy lifestyle and getting preventive care are important in promoting health and wellness. Follow your health care provider's instructions about healthy diet, exercising, and getting tested or screened for diseases. Follow your health care provider's instructions on monitoring your cholesterol and blood pressure. This information is not intended to replace advice given to you by your health care provider. Make sure you discuss any questions you have with your health care provider. Document Revised: 05/03/2021 Document Reviewed: 05/03/2021 Elsevier Patient Education  2023 [employer]

## 2022-08-01 ENCOUNTER — Encounter: Payer: Self-pay | Admitting: Medical-Surgical

## 2022-08-08 ENCOUNTER — Encounter: Payer: Self-pay | Admitting: Medical-Surgical

## 2022-08-15 NOTE — Progress Notes (Unsigned)
   Established Patient Office Visit  Subjective   Patient ID: Castle Lamons, male   DOB: 1956/07/18 Age: 66 y.o. MRN: [id number]   No chief complaint on file.   HPI Pleasant 66 year old male presenting today for the following:  Hypertension:  Stomach issues:  Tobacco use: Objective:    There were no vitals filed for this visit.  Physical Exam   No results found for this or any previous visit (from the past 24 hour(s)).   {Labs (Optional):23779}  The 10-year ASCVD risk score (Arnett DK, et al., 2019) is: 53.2%   Values used to calculate the score:     Age: 89 years     Sex: Male     Is Non-Hispanic African American: No     Diabetic: Yes     Tobacco smoker: Yes     Systolic Blood Pressure: 176 mmHg     Is BP treated: Yes     HDL Cholesterol: 40 mg/dL     Total Cholesterol: 163 mg/dL   Assessment & Plan:   No problem-specific Assessment & Plan notes found for this encounter.   No follow-ups on file.  ___________________________________________ Thayer Ohm, DNP, APRN, FNP-BC Primary Care and Sports Medicine [facility] [place]

## 2022-08-16 ENCOUNTER — Ambulatory Visit (INDEPENDENT_AMBULATORY_CARE_PROVIDER_SITE_OTHER): Payer: Federal, State, Local not specified - PPO | Admitting: Medical-Surgical

## 2022-08-16 ENCOUNTER — Encounter: Payer: Self-pay | Admitting: Medical-Surgical

## 2022-08-16 VITALS — BP 137/79 | HR 73 | Resp 20 | Ht 75.0 in | Wt 193.1 lb

## 2022-08-16 DIAGNOSIS — B353 Tinea pedis: Secondary | ICD-10-CM | POA: Diagnosis not present

## 2022-08-16 DIAGNOSIS — M21611 Bunion of right foot: Secondary | ICD-10-CM

## 2022-08-16 DIAGNOSIS — Z23 Encounter for immunization: Secondary | ICD-10-CM

## 2022-08-16 DIAGNOSIS — Z716 Tobacco abuse counseling: Secondary | ICD-10-CM | POA: Diagnosis not present

## 2022-08-16 DIAGNOSIS — I10 Essential (primary) hypertension: Secondary | ICD-10-CM | POA: Diagnosis not present

## 2022-08-16 DIAGNOSIS — E1151 Type 2 diabetes mellitus with diabetic peripheral angiopathy without gangrene: Secondary | ICD-10-CM

## 2022-08-16 MED ORDER — PANTOPRAZOLE SODIUM 20 MG PO TBEC
20.0000 mg | DELAYED_RELEASE_TABLET | Freq: Every day | ORAL | 1 refills | Status: DC
Start: 2022-08-16 — End: 2024-08-30

## 2022-08-16 MED ORDER — ERYTHROMYCIN 5 MG/GM OP OINT: 1.0000 | TOPICAL_OINTMENT | Freq: Two times a day (BID) | OPHTHALMIC | 0 refills | Status: AC

## 2022-08-16 MED ORDER — CLOTRIMAZOLE-BETAMETHASONE 1-0.05 % EX CREA: 1.0000 | TOPICAL_CREAM | Freq: Every day | CUTANEOUS | 0 refills | Status: DC

## 2022-08-16 MED ORDER — ZOLPIDEM TARTRATE 10 MG PO TABS
10.0000 mg | ORAL_TABLET | Freq: Every evening | ORAL | 0 refills | Status: DC | PRN
Start: 2022-08-16 — End: 2022-10-11

## 2022-08-16 MED ORDER — NICOTINE 14 MG/24HR TD PT24
14.0000 mg | MEDICATED_PATCH | Freq: Every day | TRANSDERMAL | 0 refills | Status: DC
Start: 2022-08-16 — End: 2023-03-08

## 2022-08-17 LAB — MICROALBUMIN / CREATININE URINE RATIO
Creatinine, Urine: 151 mg/dL (ref 20–320)
Microalb Creat Ratio: 3 mcg/mg creat (ref <30)
Microalb, Ur: 0.5 mg/dL

## 2022-08-25 ENCOUNTER — Ambulatory Visit: Payer: Federal, State, Local not specified - PPO | Admitting: Podiatry

## 2022-09-01 ENCOUNTER — Ambulatory Visit (INDEPENDENT_AMBULATORY_CARE_PROVIDER_SITE_OTHER): Payer: Medicare Other | Admitting: Podiatry

## 2022-09-01 ENCOUNTER — Ambulatory Visit (INDEPENDENT_AMBULATORY_CARE_PROVIDER_SITE_OTHER): Payer: Medicare Other

## 2022-09-01 ENCOUNTER — Encounter: Payer: Self-pay | Admitting: Podiatry

## 2022-09-01 DIAGNOSIS — M21611 Bunion of right foot: Secondary | ICD-10-CM

## 2022-09-01 DIAGNOSIS — M205X1 Other deformities of toe(s) (acquired), right foot: Secondary | ICD-10-CM | POA: Diagnosis not present

## 2022-09-01 DIAGNOSIS — M21619 Bunion of unspecified foot: Secondary | ICD-10-CM

## 2022-09-01 DIAGNOSIS — M2011 Hallux valgus (acquired), right foot: Secondary | ICD-10-CM | POA: Diagnosis not present

## 2022-09-01 DIAGNOSIS — M19071 Primary osteoarthritis, right ankle and foot: Secondary | ICD-10-CM | POA: Diagnosis not present

## 2022-09-01 MED ORDER — DEXAMETHASONE SODIUM PHOSPHATE 120 MG/30ML IJ SOLN
4.0000 mg | Freq: Once | INTRAMUSCULAR | Status: AC
  Administered 2022-09-01: 4 mg via INTRA_ARTICULAR

## 2022-09-01 NOTE — Progress Notes (Signed)
  Subjective:  Patient ID: Richard Fuller, male    DOB: 09-20-1956,   MRN: [id number]  Chief Complaint  Patient presents with   Bunions    Right foot bunion , patient states it is very sore and painful. Patient states he is also a diabetic and is concerned with his toes curving.    66 y.o. male presents for concern of right foot bunion that has been bothersome for the past 6 months. Relates it has been worsening and the toe has started to turn more. Relates the bunion area is very sore and nothing has been helping with pain. He is a diabetic and last A1c was 6.9.  Denies any other pedal complaints. Denies n/v/f/c.   Past Medical History:  Diagnosis Date   Allergy 1985   Anemia 2015   Anxiety    Arthritis 2000   Atrial fibrillation (HCC)    Depression    Diabetes mellitus without complication (HCC)    GERD (gastroesophageal reflux disease) 2004   Heart murmur 1996   Hyperlipidemia    Hypertension    Osteoporosis 1996    Objective:  Physical Exam: Vascular: DP/PT pulses 2/4 bilateral. CFT <3 seconds. Normal hair growth on digits. No edema.  Skin. No lacerations or abrasions bilateral feet.  Musculoskeletal: MMT 5/5 bilateral lower extremities in DF, PF, Inversion and Eversion. Deceased ROM in DF of ankle joint. Tender to palpation of medial and dorsal first MPJ. Pain with ROM of the joint particularly on end ROM. Moderate HAV deformity noted with valgus deformity of the lesser digits as well.  Neurological: Sensation intact to light touch.   Assessment:   1. Bunion   2. Hallux limitus, right      Plan:  Patient was evaluated and treated and all questions answered. -Xrays reviewed. No acute fractures or dislocations. Mild increased in IM angle 1-2 with metadductus noted. There is some lose of joint space of the first MPJ and valgus deformity noted at the proximal phalanx. Bipartite sesamoid noted as well.  -Discussed HAV and hallux limitus and treatment options;conservative and  surgical management; risks, benefits, alternatives discussed. All patient's questions answered. -Discussed padding and wide shoe gear.   -Recommend continue with good supportive shoes and inserts.  -Discussed anti-inflammatories and steroid injections.  -Patient requesting injection today. Procedure below.  -Discussed surgical options.  -Patient to return to office as needed or sooner if condition worsens.   Procedure: Injection Tendon/Ligament Discussed alternatives, risks, complications and verbal consent was obtained.  Location: Right first MPJ. Skin Prep: Alcohol. Injectate: 1cc 0.5% marcaine plain, 1 cc dexamethasone.  Disposition: Patient tolerated procedure well. Injection site dressed with a band-aid.  Post-injection care was discussed and return precautions discussed.    Louann Sjogren, DPM

## 2022-10-01 DIAGNOSIS — R6884 Jaw pain: Secondary | ICD-10-CM | POA: Diagnosis not present

## 2022-10-01 DIAGNOSIS — R600 Localized edema: Secondary | ICD-10-CM | POA: Diagnosis not present

## 2022-10-01 DIAGNOSIS — K122 Cellulitis and abscess of mouth: Secondary | ICD-10-CM | POA: Diagnosis not present

## 2022-10-11 ENCOUNTER — Other Ambulatory Visit: Payer: Self-pay | Admitting: Medical-Surgical

## 2022-10-11 NOTE — Telephone Encounter (Signed)
Last office visit 08/16/2022  Upcoming appointment 11/21/2022  Last filled 08/16/2022

## 2022-11-01 ENCOUNTER — Encounter: Payer: Self-pay | Admitting: *Deleted

## 2022-11-01 ENCOUNTER — Telehealth: Payer: Self-pay | Admitting: *Deleted

## 2022-11-01 DIAGNOSIS — E1151 Type 2 diabetes mellitus with diabetic peripheral angiopathy without gangrene: Secondary | ICD-10-CM

## 2022-11-01 NOTE — Patient Instructions (Signed)
Visit Information  Thank you for taking time to visit with me today. Please don't hesitate to contact me if I can be of assistance to you.   As we discussed today, I have asked the [employer] care Guide to contact you by phone to provide resources that may be available to you for having enough food to eat and for dental resources-- please listen out for a call from the Care Guide  Following are the goals we discussed today:   Goals Addressed             This Visit's Progress    Care Coordination Activities: No follow Up required   On track    Care Coordination Interventions: Evaluation of current treatment plan related to general health maintenance; DMII/ HTN and patient's adherence to plan as established by provider Advised patient to continue monitoring blood sugars at home; encouraged him to monitor blood pressures more consistently; encouraged him to continue his efforts at smoking cessation Provided education to patient re: signs/ symptoms low blood sugar along with corresponding action plan Reviewed medications with patient and discussed his stated desire to get off of some medications: updated medication list and encouraged patient to discuss his concerns with being on some of his medications with PCP at time of upcoming scheduled provider appointment with PCP Provided patient and/or caregiver with resource information about food acquisition and dental resources: [place] referral placed today; encouraged patient to listen out for phone call from [employer] and explained that once resources are provided, it will be up to him to act on provided resources- he verbalized understanding of same ([employer]) Provided patient with My Chart educational materials related to signs/ symptoms low blood sugar along with corresponding action plan Reviewed scheduled/upcoming provider appointments including Monday, 11/21/22 PCP Advised patient,  providing education and rationale, to check cbg daily, fasting as he has been and record, calling PCP for findings outside established parameters  Advised patient to discuss current medication regimen with provider Screening for signs and symptoms of depression related to chronic disease state  Assessed social determinant of health barriers Reviewed recent fasting blood sugars at home: he reports general ranges between 90-110; denies recent low blood sugars less than 80; states he has experienced in the past but not recently          If you are experiencing a Mental Health or [place] or need someone to talk to, please  call the Suicide and Crisis Lifeline: 988 call the [place] National Suicide Prevention Lifeline: (380)468-7339 or TTY: 501-049-3131 TTY (803)405-4976) to talk to a trained counselor call 1-800-273-TALK (toll free, 24 hour hotline) go to [facility] [address], [place] [phone]) call the [facility]: [phone] call 911   Patient verbalizes understanding of instructions and care plan provided today and agrees to view in [place]. Active MyChart status and patient understanding of how to access instructions and care plan via MyChart confirmed with patient.     No further follow up required: patient denies further or ongoing care coordination needs  Oneta Rack, RN, BSN, CCRN Alumnus RN CM Care Coordination/ Transition of [place] Management [phone]: direct office

## 2022-11-01 NOTE — Patient Outreach (Signed)
Care Coordination   Initial Visit Note   11/01/2022 Name: Richard Fuller MRN: [id number] DOB: May 06, 1956  Richard Fuller is a 66 y.o. year old male who sees Richard Bouche, NP for primary care. I spoke with  Richard Fuller by phone today.  What matters to the patients health and wellness today?  "I am doing pretty good; I still need all the resources for food that I can get; I also need to know what dental resources are available, I need dental care because I had a recent tooth infection.  I am checking my blood sugars in the morning and they are running good, they are always between 90-110, no real low values recently-- but I have had some in the past.  I would like to get off the metformin, it keeps my stomach tied in knots, I am going to talk to Richard Fuller about it when I see her at the end of November.  I am still smoking, but not much, at most, about half a pack a day.  I did not try the nicotine patches."   Interventions provided today, including [employer] Guide referral for food insecurity and dental care; patient denies further or ongoing care coordination needs- no follow up required   Goals Addressed             This Visit's Progress    Care Coordination Activities: No follow Up required   On track    Care Coordination Interventions: Evaluation of current treatment plan related to general health maintenance; DMII/ HTN and patient's adherence to plan as established by provider Advised patient to continue monitoring blood sugars at home; encouraged him to monitor blood pressures more consistently; encouraged him to continue his efforts at smoking cessation Provided education to patient re: signs/ symptoms low blood sugar along with corresponding action plan Reviewed medications with patient and discussed his stated desire to get off of some medications: updated medication list and encouraged patient to discuss his concerns with being on some of his medications with PCP at time of upcoming  scheduled provider appointment with PCP Provided patient and/or caregiver with resource information about food acquisition and dental resources: [employer] Guide referral placed today; encouraged patient to listen out for phone call from [employer] and explained that once resources are provided, it will be up to him to act on provided resources- he verbalized understanding of same ([employer]) Provided patient with My Chart educational materials related to signs/ symptoms low blood sugar along with corresponding action plan Reviewed scheduled/upcoming provider appointments including Monday, 11/21/22 PCP Advised patient, providing education and rationale, to check cbg daily, fasting as he has been and record, calling PCP for findings outside established parameters  Advised patient to discuss current medication regimen with provider Screening for signs and symptoms of depression related to chronic disease state  Assessed social determinant of health barriers Reviewed recent fasting blood sugars at home: he reports general ranges between 90-110; denies recent low blood sugars less than 80; states he has experienced in the past but not recently          SDOH assessments and interventions completed:  Yes  SDOH Interventions Today    Flowsheet Row Most Recent Value  SDOH Interventions   Food Insecurity Interventions Intervention Not Indicated  [Community Resource Care Guide referral placed for food acquisition and dental resources]  Transportation Interventions Intervention Not Indicated  [drives self]       Care Coordination Interventions Activated:  Yes  Care Coordination Interventions:  Yes, provided   Follow up plan: No further intervention required.   Encounter Outcome:  Pt. Visit Completed   Richard Pina, RN, BSN, CCRN Alumnus RN CM Care Coordination/ Transition of Care- [facility] Care Management [phone]: direct office

## 2022-11-02 ENCOUNTER — Telehealth: Payer: Self-pay

## 2022-11-02 NOTE — Telephone Encounter (Signed)
   Telephone encounter was:  Successful.  11/02/2022 Name: Richard Fuller MRN: [id number] DOB: [birth year]  [name] is a 66 y.o. year old male who is a primary care patient of Christen Butter, NP . The community resource team was consulted for assistance with Food Insecurity and dental.  Care guide performed the following interventions: Spoke with patient verified email and home address. Sent informatio for food pantries and dental resources. Received email from patient confirming receipt.  Follow Up Plan:  No further follow up planned at this time. The patient has been provided with needed resources.  Iziah Cates [facility] Population Health Community Resource Care Guide   [email or web address]  ?? [phone]   Website: [email or web address]  [email or web address]

## 2022-11-04 ENCOUNTER — Other Ambulatory Visit: Payer: Self-pay | Admitting: Medical-Surgical

## 2022-11-18 ENCOUNTER — Other Ambulatory Visit: Payer: Self-pay | Admitting: Medical-Surgical

## 2022-11-21 ENCOUNTER — Encounter: Payer: Self-pay | Admitting: Medical-Surgical

## 2022-11-21 ENCOUNTER — Ambulatory Visit (INDEPENDENT_AMBULATORY_CARE_PROVIDER_SITE_OTHER): Payer: Medicare Other | Admitting: Medical-Surgical

## 2022-11-21 ENCOUNTER — Other Ambulatory Visit: Payer: Self-pay | Admitting: Medical-Surgical

## 2022-11-21 VITALS — BP 125/77 | HR 72 | Ht 75.0 in | Wt 193.0 lb

## 2022-11-21 DIAGNOSIS — F5101 Primary insomnia: Secondary | ICD-10-CM

## 2022-11-21 DIAGNOSIS — E1151 Type 2 diabetes mellitus with diabetic peripheral angiopathy without gangrene: Secondary | ICD-10-CM

## 2022-11-21 DIAGNOSIS — I10 Essential (primary) hypertension: Secondary | ICD-10-CM

## 2022-11-21 DIAGNOSIS — Z87891 Personal history of nicotine dependence: Secondary | ICD-10-CM

## 2022-11-21 DIAGNOSIS — F17210 Nicotine dependence, cigarettes, uncomplicated: Secondary | ICD-10-CM

## 2022-11-21 DIAGNOSIS — Z720 Tobacco use: Secondary | ICD-10-CM

## 2022-11-21 DIAGNOSIS — Z122 Encounter for screening for malignant neoplasm of respiratory organs: Secondary | ICD-10-CM

## 2022-11-21 DIAGNOSIS — E782 Mixed hyperlipidemia: Secondary | ICD-10-CM

## 2022-11-21 LAB — POCT GLYCOSYLATED HEMOGLOBIN (HGB A1C): HbA1c, POC (controlled diabetic range): 6.5 % (ref 0.0–7.0)

## 2022-11-21 MED ORDER — ZOLPIDEM TARTRATE 5 MG PO TABS: 5.0000 mg | ORAL_TABLET | Freq: Every evening | ORAL | 1 refills | Status: DC | PRN

## 2022-11-21 MED ORDER — RYBELSUS 3 MG PO TABS: 3.0000 mg | ORAL_TABLET | Freq: Every day | ORAL | 0 refills | Status: DC

## 2022-11-21 NOTE — Progress Notes (Signed)
Established Patient Office Visit  Subjective   Patient ID: Richard Fuller, male   DOB: June 25, 1956 Age: 66 y.o. MRN: [id number]   Chief Complaint  Patient presents with   Diabetes   Hypertension   Hyperlipidemia   HPI Pleasant 66 year old male presenting today for the following:  DM: Has been checking sugars at home with most readings in the 90s although he admits that yesterday was 133 fasting.  He attributes this to dietary choices over the Thanksgiving weekend.  He is taking metformin 500 mg twice daily as prescribed however notes this causes significant GI upset and he is interested in coming off this medication/switching to something else.  He is up-to-date on eye care.  Was prescribed a statin medication however this caused GI issues so he stopped it.  Continues to take fenofibrate.  HTN: Has not been checking blood pressures regularly at home.  Taking amlodipine 5 mg, Coreg 25 mg daily as needed, and enalapril 20 mg twice daily.  Tolerating all medications well without side effects.  Follows a low-sodium diet most of the time.  Stays physically active whenever possible. Denies CP, SOB, palpitations, lower extremity edema, dizziness, headaches, or vision changes.  HLD: Continues to take fenofibrate 48 mg daily.  Unable to tolerate pravastatin so stopped it.  Tobacco abuse: Has been smoking approximately 1/2 pack of cigarettes per day for the last 50+ years.  Knows that he should quit and the benefits of doing so however reports that he has significant difficulty with this.  Not at the point that he is ready to try medications at this time.  Insomnia: Taking Ambien 5 mg nightly, tolerating well without side effects.  Requesting refill.   Objective:    Vitals:   11/21/22 0930  BP: 125/77  Pulse: 72  Height: 6\' 3"  (1.905 m)  Weight: 193 lb (87.5 kg)  SpO2: 99%  BMI (Calculated): 24.12    Physical Exam Vitals reviewed.  Constitutional:      General: He is not in acute  distress.    Appearance: Normal appearance. He is not ill-appearing.  HENT:     Head: Normocephalic and atraumatic.  Cardiovascular:     Rate and Rhythm: Normal rate and regular rhythm.     Pulses: Normal pulses.     Heart sounds: Normal heart sounds. No murmur heard.    No friction rub. No gallop.  Pulmonary:     Effort: Pulmonary effort is normal. No respiratory distress.     Breath sounds: Normal breath sounds.  Skin:    General: Skin is warm and dry.  Neurological:     Mental Status: He is alert and oriented to person, place, and time.  Psychiatric:        Mood and Affect: Mood normal.        Behavior: Behavior normal.        Thought Content: Thought content normal.        Judgment: Judgment normal.    Results for orders placed or performed in visit on 11/21/22 (from the past 24 hour(s))  POCT HgB A1C     Status: None   Collection Time: 11/21/22  9:50 AM  Result Value Ref Range   Hemoglobin A1C     HbA1c POC (<> result, manual entry)     HbA1c, POC (prediabetic range)     HbA1c, POC (controlled diabetic range) 6.5 0.0 - 7.0 %       The 10-year ASCVD risk score (Arnett DK, et al., 2019)  is: 33.7%   Values used to calculate the score:     Age: 38 years     Sex: Male     Is Non-Hispanic African American: No     Diabetic: Yes     Tobacco smoker: Yes     Systolic Blood Pressure: 125 mmHg     Is BP treated: Yes     HDL Cholesterol: 40 mg/dL     Total Cholesterol: 163 mg/dL   Assessment & Plan:   1. Diabetes mellitus with peripheral vascular disease (HCC) POCT hemoglobin A1c 6.5%.  Poor tolerance of metformin.  Start Rybelsus 3 mg daily for 1 month.  If well-tolerated, we can increase to 7 mg daily and start to back off of metformin.  Would like him to continue monitoring sugars with a fasting goal of 120 or less. - POCT HgB A1C  2. Primary hypertension Blood pressure at goal today.  Continue enalapril, amlodipine, and Coreg as prescribed.  3. Mixed  hyperlipidemia Continue fenofibrate. Discontinue pravastatin.  Add to intolerance list.  4. Tobacco abuse CT lung cancer screening ordered today. - CT CHEST LUNG CA SCREEN LOW DOSE W/O CM; Future  5. Primary insomnia Refilling Ambien 5 mg nightly at bedtime as needed.    Return in about 3 months (around 02/21/2023) for DM follow up.  ___________________________________________ Thayer Ohm, DNP, APRN, FNP-BC Primary Care and Sports Medicine [facility]

## 2022-11-28 ENCOUNTER — Ambulatory Visit: Payer: Medicare Other

## 2022-12-02 ENCOUNTER — Other Ambulatory Visit: Payer: Self-pay | Admitting: Medical-Surgical

## 2022-12-02 ENCOUNTER — Encounter: Payer: Self-pay | Admitting: Medical-Surgical

## 2022-12-12 ENCOUNTER — Encounter: Payer: Self-pay | Admitting: Medical-Surgical

## 2022-12-12 DIAGNOSIS — M961 Postlaminectomy syndrome, not elsewhere classified: Secondary | ICD-10-CM

## 2022-12-12 MED ORDER — ESCITALOPRAM OXALATE 10 MG PO TABS: 10.0000 mg | ORAL_TABLET | Freq: Every day | ORAL | 1 refills | Status: DC

## 2022-12-12 MED ORDER — GABAPENTIN 300 MG PO CAPS: ORAL_CAPSULE | ORAL | 3 refills | Status: DC

## 2022-12-12 NOTE — Telephone Encounter (Signed)
Patient requesting rx rf of   Gabapentin ( last filled by dr. Karie Schwalbe)  Escitalopram - last corresponding note was 05/02/22  Forwarded for your review.

## 2023-02-14 IMAGING — DX DG LUMBAR SPINE COMPLETE 4+V
5 series · 5 of 5 positions shown · non-contrast
Comparison: None.

CLINICAL DATA: Lower back pain x9 days.

EXAM:
LUMBAR SPINE - COMPLETE 4+ VIEW

[l-spine ap]
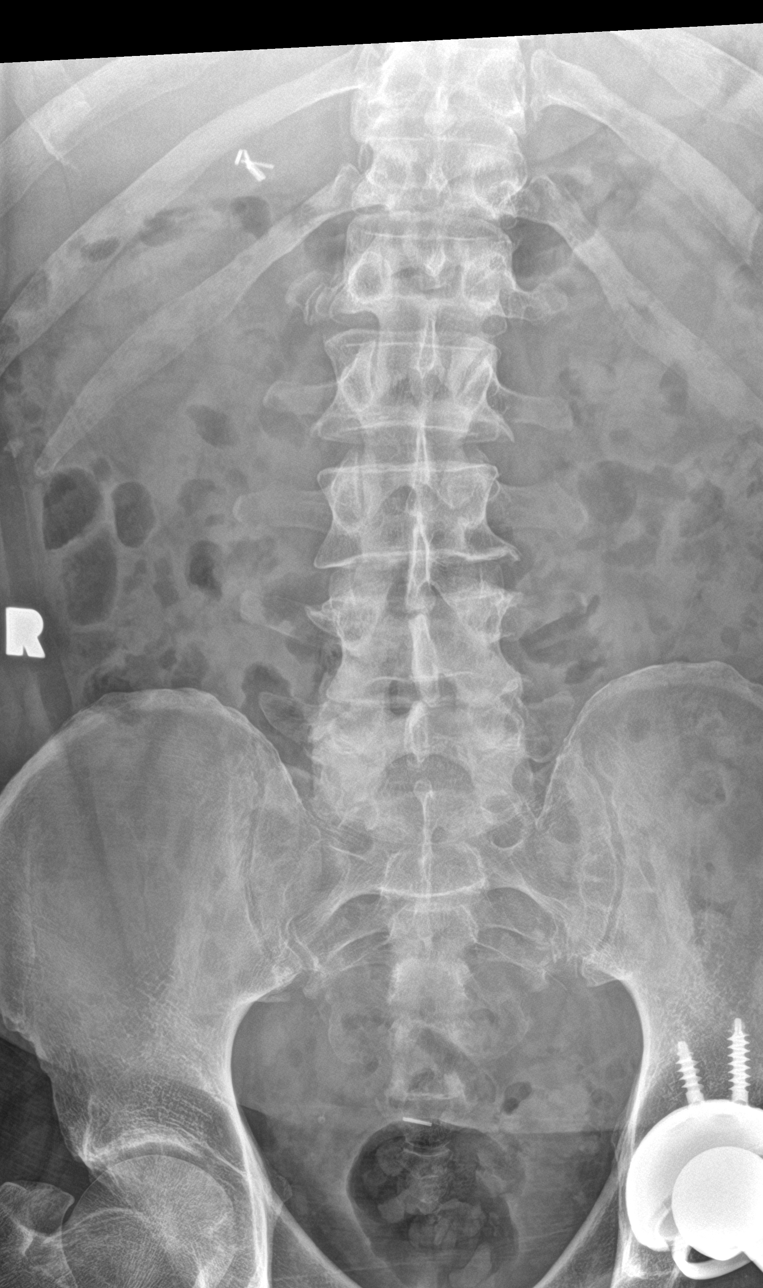

[l-spine obl (1 of 2)]
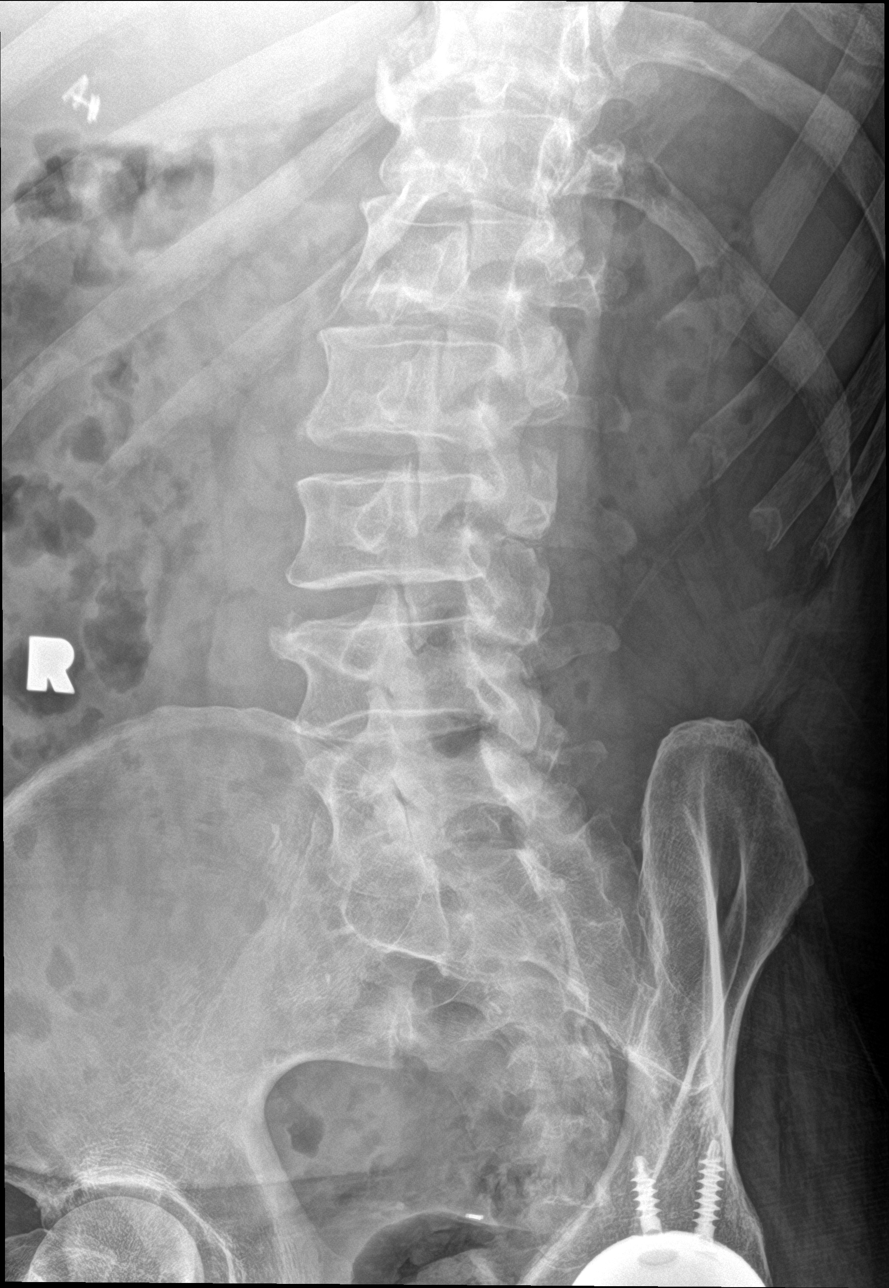

[l-spine obl (2 of 2)]
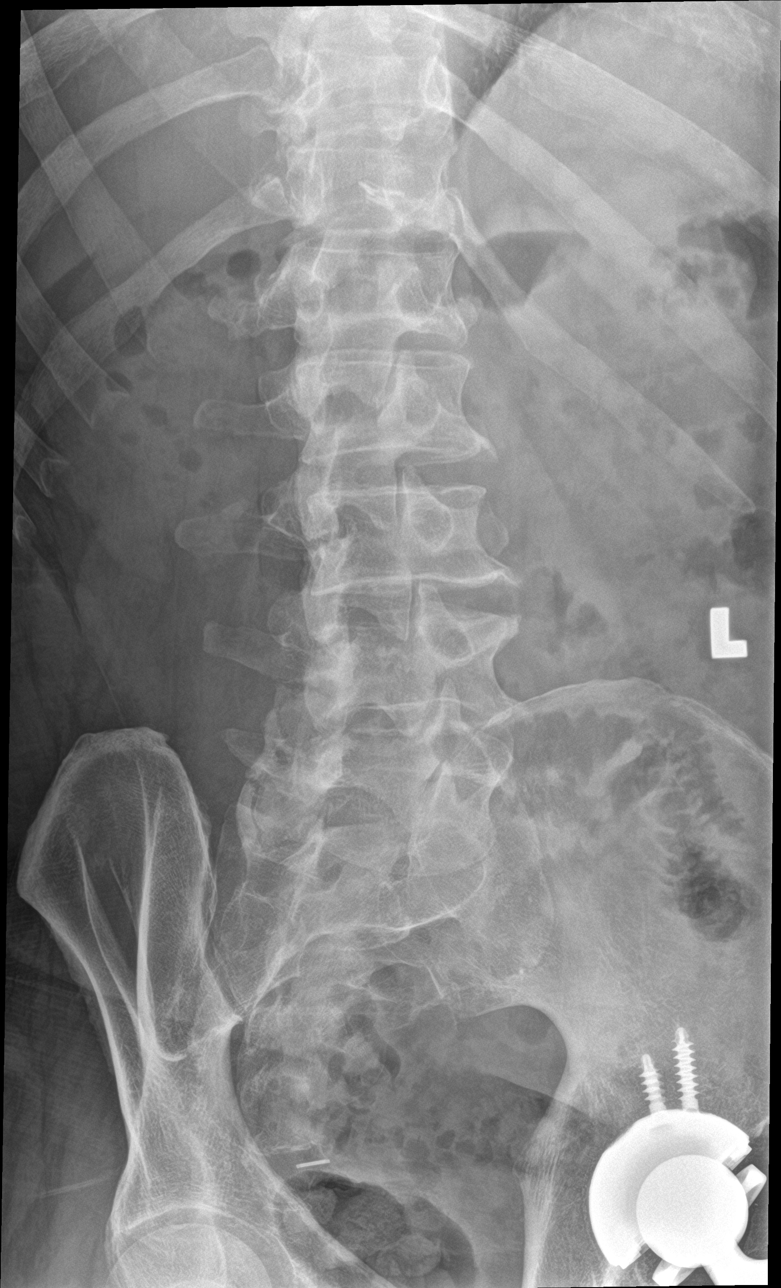

[l-spine lat (1 of 2)]
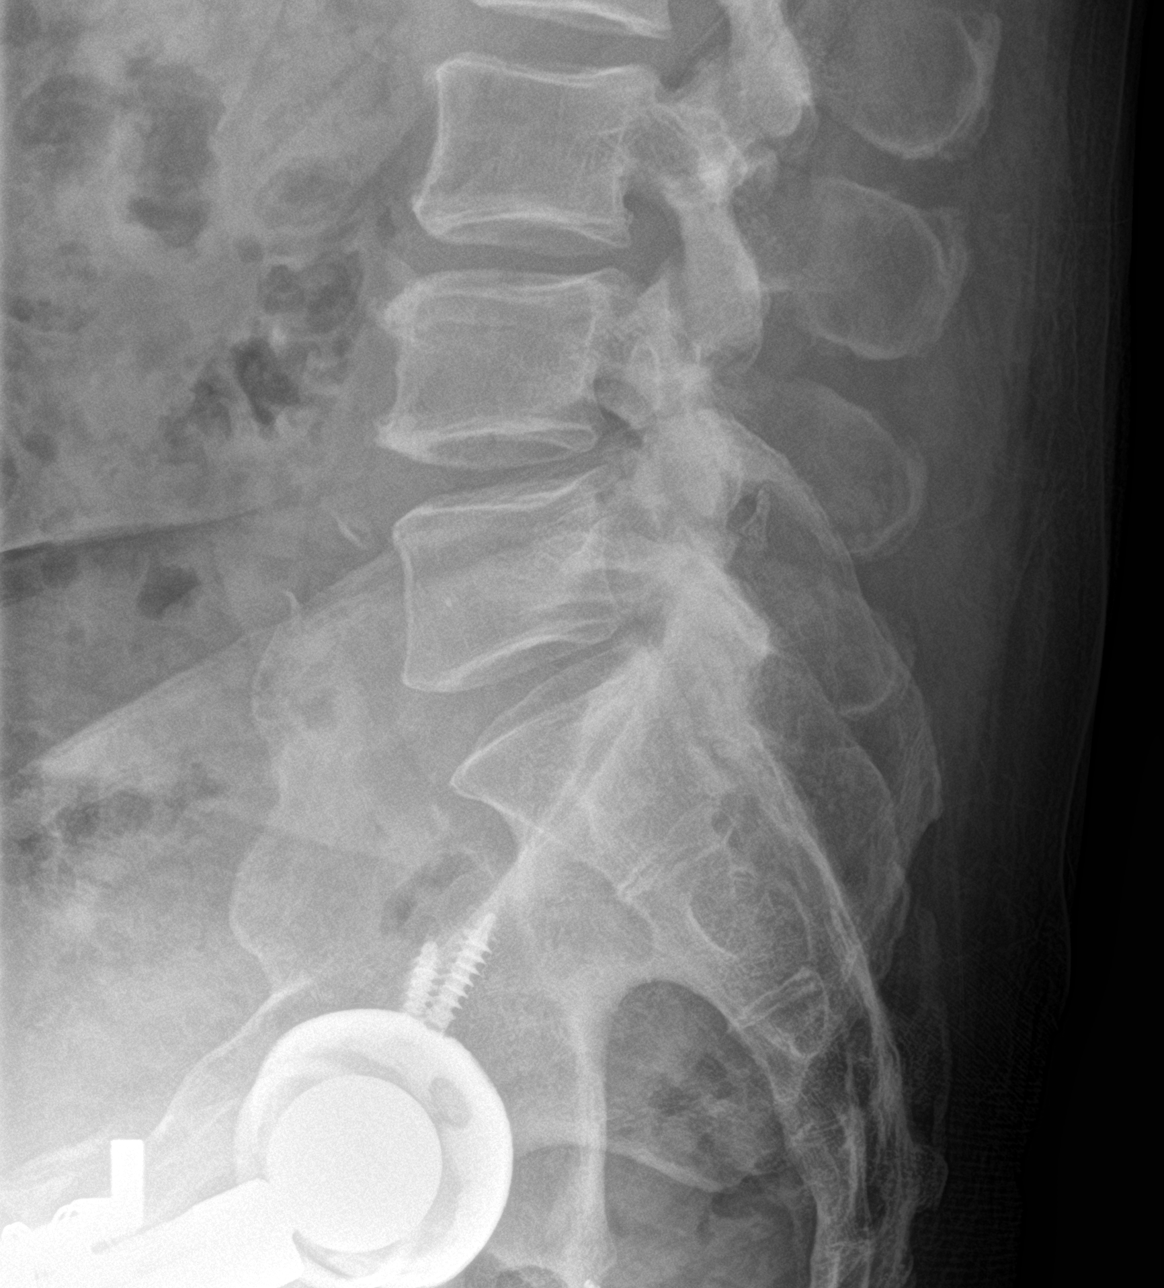

[l-spine lat (2 of 2)]
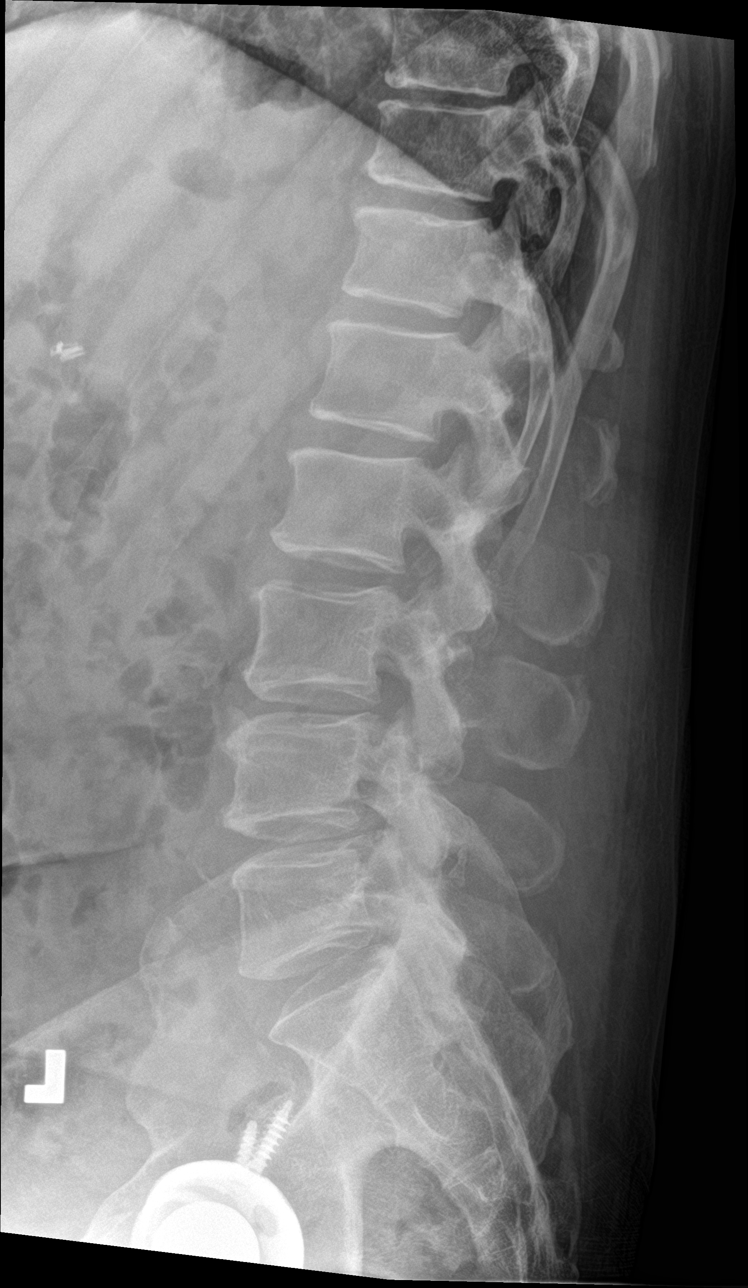

[5 of 5 positions shown; findings below may reference images not displayed]

FINDINGS: There is no evidence of acute lumbar spine fracture. Alignment is
normal. Moderate severity endplate sclerosis and mild osteophyte
formation are seen at the levels of L3-L4 and L4-L5. Mild endplate
sclerosis is seen throughout the remainder of the lumbar spine. Mild
intervertebral disc space narrowing is also seen throughout the
lumbar spine. An intact total left hip replacement is noted.
Radiopaque surgical clips are seen within the right upper quadrant.
IMPRESSION: Multilevel degenerative disc disease, most prominent at L3-L4 and
L4-L5.

## 2023-02-21 ENCOUNTER — Ambulatory Visit: Payer: Medicare Other | Admitting: Medical-Surgical

## 2023-03-08 ENCOUNTER — Ambulatory Visit (INDEPENDENT_AMBULATORY_CARE_PROVIDER_SITE_OTHER): Payer: Medicare HMO | Admitting: Medical-Surgical

## 2023-03-08 ENCOUNTER — Ambulatory Visit (INDEPENDENT_AMBULATORY_CARE_PROVIDER_SITE_OTHER): Payer: Medicare HMO

## 2023-03-08 ENCOUNTER — Encounter: Payer: Self-pay | Admitting: Medical-Surgical

## 2023-03-08 VITALS — BP 145/83 | HR 72 | Resp 20 | Ht 72.0 in | Wt 192.0 lb

## 2023-03-08 DIAGNOSIS — F17210 Nicotine dependence, cigarettes, uncomplicated: Secondary | ICD-10-CM | POA: Diagnosis not present

## 2023-03-08 DIAGNOSIS — F5101 Primary insomnia: Secondary | ICD-10-CM

## 2023-03-08 DIAGNOSIS — Z122 Encounter for screening for malignant neoplasm of respiratory organs: Secondary | ICD-10-CM

## 2023-03-08 DIAGNOSIS — J439 Emphysema, unspecified: Secondary | ICD-10-CM

## 2023-03-08 DIAGNOSIS — F17200 Nicotine dependence, unspecified, uncomplicated: Secondary | ICD-10-CM

## 2023-03-08 DIAGNOSIS — E559 Vitamin D deficiency, unspecified: Secondary | ICD-10-CM

## 2023-03-08 DIAGNOSIS — E782 Mixed hyperlipidemia: Secondary | ICD-10-CM

## 2023-03-08 DIAGNOSIS — E1151 Type 2 diabetes mellitus with diabetic peripheral angiopathy without gangrene: Secondary | ICD-10-CM | POA: Diagnosis not present

## 2023-03-08 DIAGNOSIS — I700 Atherosclerosis of aorta: Secondary | ICD-10-CM

## 2023-03-08 DIAGNOSIS — Z720 Tobacco use: Secondary | ICD-10-CM

## 2023-03-08 DIAGNOSIS — Z87891 Personal history of nicotine dependence: Secondary | ICD-10-CM

## 2023-03-08 DIAGNOSIS — I10 Essential (primary) hypertension: Secondary | ICD-10-CM | POA: Diagnosis not present

## 2023-03-08 LAB — POCT GLYCOSYLATED HEMOGLOBIN (HGB A1C)
HbA1c, POC (prediabetic range): 6.3 % (ref 5.7–6.4)
Hemoglobin A1C: 6.3 % — AB (ref 4.0–5.6)

## 2023-03-08 MED ORDER — METFORMIN HCL ER 750 MG PO TB24: 750.0000 mg | ORAL_TABLET | Freq: Every day | ORAL | 0 refills | Status: DC

## 2023-03-08 MED ORDER — ZOLPIDEM TARTRATE 5 MG PO TABS: 5.0000 mg | ORAL_TABLET | Freq: Every evening | ORAL | 1 refills | Status: DC | PRN

## 2023-03-08 NOTE — Progress Notes (Signed)
Established Patient Office Visit  Subjective   Patient ID: Richard Fuller, male   DOB: Apr 21, 1956 Age: 67 y.o. MRN: [id number]   Chief Complaint  Patient presents with   Follow-up   Diabetes   HPI Pleasant 67 year old male presenting today for the following:  Diabetes: Has been checking sugars and overall he is levels have been 120 or less.  Did not tolerate Rybelsus so went back to taking metformin 500 mg twice daily.  This still causes significant GI bloating and some discomfort but it is much better than the Rybelsus was.  Hypertension: Blood pressure elevated on arrival today.  Taking enalapril 20 mg 2 times daily.  Also taking amlodipine 5 mg daily.  Tolerating both medications well without side effects.  Following a low-sodium diet.  Insomnia: Continues to take Ambien 5 mg nightly as needed to help with sleep.  The medication is working well for him and he has no side effects or concerns.     Objective:    Vitals:   03/08/23 0932 03/08/23 1004  BP: (!) 143/80 (!) 145/83  Pulse: 69 72  Resp: 20 20  Height: 6' (1.829 m)   Weight: 192 lb 0.6 oz (87.1 kg)   SpO2: 97% 100%  BMI (Calculated): 26.04     Physical Exam Vitals reviewed.  Constitutional:      General: He is not in acute distress.    Appearance: Normal appearance. He is not ill-appearing.  HENT:     Head: Normocephalic and atraumatic.  Cardiovascular:     Rate and Rhythm: Normal rate and regular rhythm.     Pulses: Normal pulses.     Heart sounds: Normal heart sounds. No murmur heard.    No friction rub. No gallop.  Pulmonary:     Effort: Pulmonary effort is normal. No respiratory distress.     Breath sounds: Normal breath sounds.  Skin:    General: Skin is warm and dry.  Neurological:     Mental Status: He is alert and oriented to person, place, and time.  Psychiatric:        Mood and Affect: Mood normal.        Behavior: Behavior normal.        Thought Content: Thought content normal.         Judgment: Judgment normal.    Results for orders placed or performed in visit on 03/08/23 (from the past 24 hour(s))  POCT HgB A1C     Status: Abnormal   Collection Time: 03/08/23 10:03 AM  Result Value Ref Range   Hemoglobin A1C 6.3 (A) 4.0 - 5.6 %   HbA1c POC (<> result, manual entry)     HbA1c, POC (prediabetic range) 6.3 5.7 - 6.4 %   HbA1c, POC (controlled diabetic range)         The 10-year ASCVD risk score (Arnett DK, et al., 2019) is: 43.2%   Values used to calculate the score:     Age: 59 years     Sex: Male     Is Non-Hispanic African American: No     Diabetic: Yes     Tobacco smoker: Yes     Systolic Blood Pressure: [id number] mmHg     Is BP treated: Yes     HDL Cholesterol: 40 mg/dL     Total Cholesterol: 163 mg/dL   Assessment & Plan:   1. Diabetes mellitus with peripheral vascular disease (HCC) POCT hemoglobin A1c 6.3% today.  Since he is struggling with GI  symptoms with metformin, we are switching him to metformin XR 750 mg daily.  We will give this a 30-day try and if well-tolerated, plan to switch over and recheck his A1c in 6 months.  Patient verbalized understanding is agreeable to the plan. - POCT HgB A1C  2. Primary hypertension Blood pressure remained elevated on recheck.  Checking labs as below.  Continue enalapril 20 mg twice daily and amlodipine 5 mg daily.  Work on sodium restriction and make sure to get regular intentional exercise. - CBC with Differential/Platelet - COMPLETE METABOLIC PANEL WITH GFR - Lipid panel  3. Mixed hyperlipidemia Checking labs as below.  Continue atorvastatin as prescribed. - COMPLETE METABOLIC PANEL WITH GFR - Lipid panel  4. Vitamin D deficiency Checking vitamin D. - VITAMIN D 25 Hydroxy (Vit-D Deficiency, Fractures)  5. Primary insomnia Refilling Ambien 5 mg nightly as needed.  Return in about 6 months (around 09/08/2023) for DM/HTN/HLD follow up.  ___________________________________________ Clearnce Sorrel, DNP,  APRN, FNP-BC Primary Care and [place]

## 2023-03-09 LAB — COMPLETE METABOLIC PANEL WITH GFR
AG Ratio: 1.5 (calc) (ref 1.0–2.5)
ALT: 11 U/L (ref 9–46)
AST: 20 U/L (ref 10–35)
Albumin: 4.6 g/dL (ref 3.6–5.1)
Alkaline phosphatase (APISO): 45 U/L (ref 35–144)
BUN: 11 mg/dL (ref 7–25)
CO2: 29 mmol/L (ref 20–32)
Calcium: 9.3 mg/dL (ref 8.6–10.3)
Chloride: 105 mmol/L (ref 98–110)
Creat: 0.95 mg/dL (ref 0.70–1.35)
Globulin: 3 g/dL (calc) (ref 1.9–3.7)
Glucose, Bld: 90 mg/dL (ref 65–99)
Potassium: 4.2 mmol/L (ref 3.5–5.3)
Sodium: 141 mmol/L (ref 135–146)
Total Bilirubin: 0.6 mg/dL (ref 0.2–1.2)
Total Protein: 7.6 g/dL (ref 6.1–8.1)
eGFR: 88 mL/min/{1.73_m2} (ref >=60)

## 2023-03-09 LAB — LIPID PANEL
Cholesterol: 181 mg/dL (ref <200)
HDL: 51 mg/dL (ref >=40)
LDL Cholesterol (Calc): 107 mg/dL (calc) — ABNORMAL HIGH
Non-HDL Cholesterol (Calc): 130 mg/dL (calc) — ABNORMAL HIGH (ref <130)
Total CHOL/HDL Ratio: 3.5 (calc) (ref <5.0)
Triglycerides: 119 mg/dL (ref <150)

## 2023-03-09 LAB — CBC WITH DIFFERENTIAL/PLATELET
Absolute Monocytes: 510 cells/uL (ref 200–950)
Basophils Absolute: 21 cells/uL (ref 0–200)
Basophils Relative: 0.4 %
Eosinophils Absolute: 187 cells/uL (ref 15–500)
Eosinophils Relative: 3.6 %
HCT: 42 % (ref 38.5–50.0)
Hemoglobin: 14.3 g/dL (ref 13.2–17.1)
Lymphs Abs: 1586 cells/uL (ref 850–3900)
MCH: 31.8 pg (ref 27.0–33.0)
MCHC: 34 g/dL (ref 32.0–36.0)
MCV: 93.3 fL (ref 80.0–100.0)
MPV: 10.3 fL (ref 7.5–12.5)
Monocytes Relative: 9.8 %
Neutro Abs: 2896 cells/uL (ref 1500–7800)
Neutrophils Relative %: 55.7 %
Platelets: 222 10*3/uL (ref 140–400)
RBC: 4.5 10*6/uL (ref 4.20–5.80)
RDW: 13 % (ref 11.0–15.0)
Total Lymphocyte: 30.5 %
WBC: 5.2 10*3/uL (ref 3.8–10.8)

## 2023-03-09 LAB — VITAMIN D 25 HYDROXY (VIT D DEFICIENCY, FRACTURES): Vit D, 25-Hydroxy: 29 ng/mL — ABNORMAL LOW (ref 30–100)

## 2023-03-09 MED ORDER — LOVASTATIN 10 MG PO TABS: 10.0000 mg | ORAL_TABLET | Freq: Every day | ORAL | 1 refills | Status: DC

## 2023-03-09 NOTE — Addendum Note (Signed)
Addended bySamuel Bouche on: 03/09/2023 12:14 PM   Modules accepted: Orders

## 2023-03-29 ENCOUNTER — Other Ambulatory Visit: Payer: Self-pay | Admitting: Medical-Surgical

## 2023-03-29 ENCOUNTER — Encounter: Payer: Self-pay | Admitting: Medical-Surgical

## 2023-03-29 MED ORDER — METFORMIN HCL ER 750 MG PO TB24: 750.0000 mg | ORAL_TABLET | Freq: Every day | ORAL | 1 refills | Status: DC

## 2023-03-29 MED ORDER — ENALAPRIL MALEATE 20 MG PO TABS: 20.0000 mg | ORAL_TABLET | Freq: Two times a day (BID) | ORAL | 1 refills | Status: DC

## 2023-03-29 MED ORDER — ESCITALOPRAM OXALATE 10 MG PO TABS: 10.0000 mg | ORAL_TABLET | Freq: Every day | ORAL | 1 refills | Status: DC

## 2023-04-04 ENCOUNTER — Other Ambulatory Visit: Payer: Self-pay | Admitting: Medical-Surgical

## 2023-04-04 NOTE — Telephone Encounter (Signed)
Active prescription on file for Ambien 5 mg nightly which is what he has been taking.  This was sent in as a 90-day supply on 03/08/2023.  1 refill was included.

## 2023-04-14 ENCOUNTER — Other Ambulatory Visit: Payer: Self-pay | Admitting: Medical-Surgical

## 2023-04-14 DIAGNOSIS — M961 Postlaminectomy syndrome, not elsewhere classified: Secondary | ICD-10-CM

## 2023-04-27 ENCOUNTER — Other Ambulatory Visit: Payer: Self-pay

## 2023-04-27 MED ORDER — AMLODIPINE BESYLATE 5 MG PO TABS: 5.0000 mg | ORAL_TABLET | Freq: Every day | ORAL | 0 refills | Status: DC

## 2023-04-27 MED ORDER — ASPIRIN LOW DOSE 81 MG PO TBEC: 81.0000 mg | DELAYED_RELEASE_TABLET | Freq: Every day | ORAL | 0 refills | Status: AC

## 2023-05-21 ENCOUNTER — Other Ambulatory Visit: Payer: Self-pay | Admitting: Medical-Surgical

## 2023-05-21 DIAGNOSIS — M961 Postlaminectomy syndrome, not elsewhere classified: Secondary | ICD-10-CM

## 2023-05-23 ENCOUNTER — Other Ambulatory Visit: Payer: Self-pay | Admitting: Medical-Surgical

## 2023-05-23 DIAGNOSIS — M961 Postlaminectomy syndrome, not elsewhere classified: Secondary | ICD-10-CM

## 2023-05-25 MED ORDER — GABAPENTIN 300 MG PO CAPS
600.0000 mg | ORAL_CAPSULE | Freq: Two times a day (BID) | ORAL | 3 refills | Status: DC
Start: 2023-05-25 — End: 2023-10-12

## 2023-05-29 ENCOUNTER — Telehealth: Payer: Self-pay | Admitting: Medical-Surgical

## 2023-05-29 NOTE — Telephone Encounter (Signed)
Patients spouse Clotilde Dieter) dropped off FMLA forms to be completed by provider. Forms placed in nurse Okey Regal) box to be completed.

## 2023-05-30 NOTE — Telephone Encounter (Signed)
Richard Fuller has completed forms and gave to me to make a copy for scan and our records.  I gave to the front desk to contact the patient for the $29 form fee.

## 2023-06-06 ENCOUNTER — Other Ambulatory Visit: Payer: Self-pay

## 2023-06-06 MED ORDER — FENOFIBRATE 48 MG PO TABS: 48.0000 mg | ORAL_TABLET | Freq: Every day | ORAL | 0 refills | Status: DC

## 2023-06-21 DIAGNOSIS — Z0100 Encounter for examination of eyes and vision without abnormal findings: Secondary | ICD-10-CM | POA: Diagnosis not present

## 2023-06-21 DIAGNOSIS — E119 Type 2 diabetes mellitus without complications: Secondary | ICD-10-CM | POA: Diagnosis not present

## 2023-06-21 DIAGNOSIS — H52223 Regular astigmatism, bilateral: Secondary | ICD-10-CM | POA: Diagnosis not present

## 2023-06-26 ENCOUNTER — Other Ambulatory Visit: Payer: Self-pay | Admitting: Medical-Surgical

## 2023-07-10 ENCOUNTER — Encounter: Payer: Self-pay | Admitting: Medical-Surgical

## 2023-07-10 MED ORDER — CARVEDILOL 25 MG PO TABS: 25.0000 mg | ORAL_TABLET | Freq: Every day | ORAL | 0 refills | Status: DC

## 2023-08-01 ENCOUNTER — Ambulatory Visit (INDEPENDENT_AMBULATORY_CARE_PROVIDER_SITE_OTHER): Payer: Medicare HMO | Admitting: Medical-Surgical

## 2023-08-01 ENCOUNTER — Telehealth: Payer: Self-pay | Admitting: General Practice

## 2023-08-01 DIAGNOSIS — Z0000 Encounter for general adult medical examination without abnormal findings: Secondary | ICD-10-CM | POA: Diagnosis not present

## 2023-08-01 MED ORDER — CLOTRIMAZOLE-BETAMETHASONE 1-0.05 % EX CREA: 1.0000 | TOPICAL_CREAM | Freq: Every day | CUTANEOUS | 0 refills | Status: AC

## 2023-08-01 NOTE — Patient Instructions (Addendum)
MEDICARE ANNUAL WELLNESS VISIT Health Maintenance Summary and Written Plan of Care  Richard Fuller ,  Thank you for allowing me to perform your Medicare Annual Wellness Visit and for your ongoing commitment to your health.   Health Maintenance & Immunization History Health Maintenance  Topic Date Due   Diabetic kidney evaluation - Urine ACR  08/17/2023   OPHTHALMOLOGY EXAM  08/01/2023 (Originally 02/01/1966)   COVID-19 Vaccine (3 - 2023-24 season) 08/17/2023 (Originally 08/26/2022)   INFLUENZA VACCINE  09/25/2023 (Originally 07/27/2023)   Zoster Vaccines- Shingrix (2 of 2) 11/01/2023 (Originally 09/24/2022)   Colonoscopy  07/31/2024 (Originally 05/04/2023)   FOOT EXAM  08/17/2023   HEMOGLOBIN A1C  09/08/2023   Lung Cancer Screening  03/07/2024   Diabetic kidney evaluation - eGFR measurement  03/07/2024   Medicare Annual Wellness (AWV)  07/31/2024   DTaP/Tdap/Td (2 - Td or Tdap) 11/02/2025   Pneumonia Vaccine 50+ Years old  Completed   Hepatitis C Screening  Completed   HPV VACCINES  Aged Out   Immunization History  Administered Date(s) Administered   Influenza Split 10/18/2013, 09/07/2015, 09/15/2018   Influenza, Seasonal, Injecte, Preservative Fre 11/13/2014   Influenza,inj,Quad PF,6+ Mos 11/05/2020, 08/16/2022   Influenza,inj,Quad PF,6-35 Mos 10/18/2013, 09/07/2015   Influenza-Unspecified 11/13/2014, 10/19/2021   PFIZER(Purple Top)SARS-COV-2 Vaccination 04/27/2020, 05/18/2020   PNEUMOCOCCAL CONJUGATE-20 07/30/2022   Pneumococcal Polysaccharide-23 04/21/2014   Tdap 11/03/2015   Zoster Recombinant(Shingrix) 07/30/2022    These are the patient goals that we discussed:  Goals Addressed               This Visit's Progress     Patient Stated (pt-stated)        08/01/2023 AWV Goal: Exercise for General Health  Patient will verbalize understanding of the benefits of increased physical activity: Exercising regularly is important. It will improve your overall fitness, flexibility, and  endurance. Regular exercise also will improve your overall health. It can help you control your weight, reduce stress, and improve your bone density. Over the next year, patient will increase physical activity as tolerated with a goal of at least 150 minutes of moderate physical activity per week.  You can tell that you are exercising at a moderate intensity if your heart starts beating faster and you start breathing faster but can still hold a conversation. Moderate-intensity exercise ideas include: Walking 1 mile (1.6 km) in about 15 minutes Biking Hiking Golfing Dancing Water aerobics Patient will verbalize understanding of everyday activities that increase physical activity by providing examples like the following: Yard work, such as: Insurance underwriter Gardening Washing windows or floors Patient will be able to explain general safety guidelines for exercising:  Before you start a new exercise program, talk with your health care provider. Do not exercise so much that you hurt yourself, feel dizzy, or get very short of breath. Wear comfortable clothes and wear shoes with good support. Drink plenty of water while you exercise to prevent dehydration or heat stroke. Work out until your breathing and your heartbeat get faster.          This is a list of Health Maintenance Items that are overdue or due now: Health Maintenance Due  Topic Date Due   Diabetic kidney evaluation - Urine ACR  08/17/2023   Diabetic eye exam- completed in July; will have records sent.  Colonoscopy - patient will call gastro office at [facility] in [place] and schedule.  2nd dose of shingles vaccine- will confirm at the pharmacy if he already completed it.  Orders/Referrals Placed Today: No orders of the defined types were placed in this encounter.  (Contact our referral department at  [phone] if you have not spoken with someone about your referral appointment within the next 5 days)    Follow-up Plan Follow-up with Christen Butter, NP as planned  Diabetic eye exam- completed in July; will have records sent.  Colonoscopy - patient will call gastro office at [facility] in [place] and schedule. 2nd dose of shingles vaccine- will confirm at the pharmacy if he already completed it. Medicare wellness visit in one year.  Patient will access AVS on my chart.

## 2023-08-01 NOTE — Progress Notes (Signed)
MEDICARE ANNUAL WELLNESS VISIT  08/01/2023  Telephone Visit Disclaimer This Medicare AWV was conducted by telephone due to national recommendations for restrictions regarding the COVID-19 Pandemic (e.g. social distancing).  I verified, using two identifiers, that I am speaking with Richard Fuller or their authorized healthcare agent. I discussed the limitations, risks, security, and privacy concerns of performing an evaluation and management service by telephone and the potential availability of an in-person appointment in the future. The patient expressed understanding and agreed to proceed.  Location of Patient: Home Location of Provider (nurse):  in the office.  Subjective:    Richard Fuller is a 67 y.o. male patient of Richard Butter, NP who had a Medicare Annual Wellness Visit today via telephone. Richard Fuller is Disabled and lives with their spouse. he has 7 children and 2 step children. he reports that he is socially active and does interact with friends/family regularly. he is moderately physically active and enjoys lifting weights.  Patient Care Team: Richard Butter, NP as PCP - General (Nurse Practitioner)     08/01/2023   10:11 AM 07/27/2022   10:12 AM  Advanced Directives  Does Patient Have a Medical Advance Directive? No No  Would patient like information on creating a medical advance directive? No - Patient declined No - Patient declined    Hospital Utilization Over the Past 12 Months: # of hospitalizations or ER visits: 0 # of surgeries: 0  Review of Systems    Patient reports that his overall health is better compared to last year.  History obtained from chart review and the patient  Patient Reported Readings (BP, Pulse, CBG, Weight, etc) Per patient no change in vitals since last visit, unable to obtain new vitals due to telehealth visit and lack of equipment.  Pain Assessment Pain : No/denies pain     Current Medications & Allergies (verified) Allergies as of 08/01/2023        Reactions   Semaglutide Other (See Comments)   Rybelsus caused GI upset and discomfort   Codeine    Headache        Medication List        Accurate as of August 01, 2023 10:30 AM. If you have any questions, ask your nurse or doctor.          amLODipine 5 MG tablet Commonly known as: NORVASC Take 1 tablet (5 mg total) by mouth daily.   Aspirin Low Dose 81 MG tablet Generic drug: aspirin EC Take 1 tablet (81 mg total) by mouth daily.   carvedilol 25 MG tablet Commonly known as: COREG Take 1 tablet (25 mg total) by mouth daily.   clotrimazole-betamethasone cream Commonly known as: LOTRISONE Apply 1 Application topically daily.   cyclobenzaprine 10 MG tablet Commonly known as: FLEXERIL Take 1 tablet (10 mg total) by mouth 3 (three) times daily as needed for muscle spasms.   enalapril 20 MG tablet Commonly known as: VASOTEC TAKE 1 TABLET(20 MG) BY MOUTH TWICE DAILY   escitalopram 10 MG tablet Commonly known as: LEXAPRO Take 1 tablet (10 mg total) by mouth daily.   fenofibrate 48 MG tablet Commonly known as: TRICOR Take 1 tablet (48 mg total) by mouth daily.   gabapentin 300 MG capsule Commonly known as: NEURONTIN Take 2 capsules (600 mg total) by mouth 2 (two) times daily.   lovastatin 10 MG tablet Commonly known as: MEVACOR Take 1 tablet (10 mg total) by mouth at bedtime.   metFORMIN 750 MG 24 hr tablet Commonly  known as: GLUCOPHAGE-XR Take 1 tablet (750 mg total) by mouth daily with breakfast. What changed: Another medication with the same name was removed. Continue taking this medication, and follow the directions you see here.   pantoprazole 20 MG tablet Commonly known as: PROTONIX Take 1 tablet (20 mg total) by mouth daily.   zolpidem 5 MG tablet Commonly known as: AMBIEN Take 1 tablet (5 mg total) by mouth at bedtime as needed for sleep.        History (reviewed): Past Medical History:  Diagnosis Date   Allergy 1985   Anemia 2015    Anxiety    Arthritis 2000   Atrial fibrillation (HCC)    Depression    Diabetes mellitus without complication (HCC)    GERD (gastroesophageal reflux disease) 2004   Heart murmur 1996   Hyperlipidemia    Hypertension    Osteoporosis 1996   Past Surgical History:  Procedure Laterality Date   CHOLECYSTECTOMY     FOOT SURGERY     FRACTURE SURGERY  1996   HERNIA REPAIR     JOINT REPLACEMENT     Had 5 hip surgery   KNEE SURGERY     SPINE SURGERY  2016   Family History  Problem Relation Age of Onset   Diabetes Mother    Hypertension Mother    Breast cancer Mother    Cancer Mother    Diabetes Brother    Hypertension Brother    Hypertension Maternal Grandfather    Diabetes Maternal Grandfather    Arthritis Father    Social History   Socioeconomic History   Marital status: Married    Spouse name: Richard Fuller   Number of children: 7   Years of education: 14   Highest education level: Associate degree: occupational, Scientist, product/process development, or vocational program  Occupational History   Occupation: Disabled  Tobacco Use   Smoking status: Every Day    Current packs/day: 0.50    Average packs/day: 0.5 packs/day for 53.0 years (26.5 ttl pk-yrs)    Types: Cigarettes   Smokeless tobacco: Never  Vaping Use   Vaping status: Former  Substance and Sexual Activity   Alcohol use: Not Currently   Drug use: Yes    Types: Marijuana    Comment: 1-2 uses per week   Sexual activity: Yes    Partners: Female    Birth control/protection: None  Other Topics Concern   Not on file  Social History Narrative   Lives with his spouse. He has 7 children and two step children. 34 grandchildren. 6 great grandchildren. He enjoys lifting weights.   Social Determinants of Health   Financial Resource Strain: Medium Risk (07/28/2023)   Overall Financial Resource Strain (CARDIA)    Difficulty of Paying Living Expenses: Somewhat hard  Food Insecurity: Food Insecurity Present (07/28/2023)   Hunger Vital  Sign    Worried About Running Out of Food in the Last Year: Often true    Ran Out of Food in the Last Year: Often true  Transportation Needs: No Transportation Needs (07/28/2023)   PRAPARE - Administrator, Civil Service (Medical): No    Lack of Transportation (Non-Medical): No  Physical Activity: Sufficiently Active (07/28/2023)   Exercise Vital Sign    Days of Exercise per Week: 3 days    Minutes of Exercise per Session: 60 min  Stress: No Stress Concern Present (07/28/2023)   [employer] of Occupational Health - Occupational Stress Questionnaire    Feeling of Stress :  Only a little  Social Connections: Moderately Isolated (08/01/2023)   Social Connection and Isolation Panel [NHANES]    Frequency of Communication with Friends and Family: Three times a week    Frequency of Social Gatherings with Friends and Family: Three times a week    Attends Religious Services: Never    Active Member of Clubs or Organizations: No    Attends Banker Meetings: Never    Marital Status: Married    Activities of Daily Living    08/01/2023   10:16 AM 08/01/2023   10:12 AM  In your present state of health, do you have any difficulty performing the following activities:  Hearing? 1   Comment some hearing loss in right ear   In the past six months, have you accidently leaked urine?  --  Comment  urgency  Do you have problems with loss of bowel control?  N  Comment  urgency    Patient Education/ Literacy How often do you need to have someone help you when you read instructions, pamphlets, or other written materials from your doctor or pharmacy?: 3 - Sometimes What is the last grade level you completed in school?: Technical diploma  Exercise    Diet Patient reports consuming 1 meals a day and 3 snack(s) a day Patient reports that his primary diet is: Regular Patient reports that she does have regular access to food.   Depression Screen    08/01/2023   10:19 AM 08/01/2023    10:13 AM 03/08/2023    9:36 AM 11/21/2022    9:32 AM 11/01/2022    1:59 PM 07/27/2022   10:05 AM 06/01/2022   10:31 AM  PHQ 2/9 Scores  PHQ - 2 Score 0 0 0 0 0 0 0  PHQ- 9 Score 0           Fall Risk    08/01/2023   10:11 AM 07/28/2023   12:10 PM 03/08/2023    9:35 AM 07/27/2022   10:05 AM 07/23/2022    9:47 AM  Fall Risk   Falls in the past year? 0 0 0 1 1  Number falls in past yr: 0 0 0 0 0  Injury with Fall? 0 0 0 0 0  Risk for fall due to : Impaired mobility  No Fall Risks Impaired mobility;Impaired balance/gait;History of fall(s)   Follow up Education provided;Falls evaluation completed;Falls prevention discussed  Falls evaluation completed Falls evaluation completed;Education provided;Falls prevention discussed      Objective:  Mckenzie Nudd seemed alert and oriented and he participated appropriately during our telephone visit.  Blood Pressure Weight BMI  BP Readings from Last 3 Encounters:  03/08/23 (!) 145/83  11/21/22 125/77  08/16/22 137/79   Wt Readings from Last 3 Encounters:  03/08/23 192 lb 0.6 oz (87.1 kg)  03/08/23 185 lb (83.9 kg)  11/21/22 193 lb (87.5 kg)   BMI Readings from Last 1 Encounters:  03/08/23 26.05 kg/m    *Unable to obtain current vital signs, weight, and BMI due to telephone visit type  Hearing/Vision  Eldred did not seem to have difficulty with hearing/understanding during the telephone conversation Reports that he has had a formal eye exam by an eye care professional within the past year Reports that he has not had a formal hearing evaluation within the past year *Unable to fully assess hearing and vision during telephone visit type  Cognitive Function:    08/01/2023   10:19 AM 07/27/2022   10:18 AM  6CIT  Screen  What Year? 0 points 0 points  What month? 0 points 0 points  What time? 0 points 0 points  Count back from 20 0 points 0 points  Months in reverse 0 points 0 points  Repeat phrase 2 points 2 points  Total Score 2 points 2 points    (Normal:0-7, Significant for Dysfunction: >8)  Normal Cognitive Function Screening: Yes   Immunization & Health Maintenance Record Immunization History  Administered Date(s) Administered   Influenza Split 10/18/2013, 09/07/2015, 09/15/2018   Influenza, Seasonal, Injecte, Preservative Fre 11/13/2014   Influenza,inj,Quad PF,6+ Mos 11/05/2020, 08/16/2022   Influenza,inj,Quad PF,6-35 Mos 10/18/2013, 09/07/2015   Influenza-Unspecified 11/13/2014, 10/19/2021   PFIZER(Purple Top)SARS-COV-2 Vaccination 04/27/2020, 05/18/2020   PNEUMOCOCCAL CONJUGATE-20 07/30/2022   Pneumococcal Polysaccharide-23 04/21/2014   Tdap 11/03/2015   Zoster Recombinant(Shingrix) 07/30/2022    Health Maintenance  Topic Date Due   Diabetic kidney evaluation - Urine ACR  08/17/2023   OPHTHALMOLOGY EXAM  08/01/2023 (Originally 02/01/1966)   COVID-19 Vaccine (3 - 2023-24 season) 08/17/2023 (Originally 08/26/2022)   INFLUENZA VACCINE  09/25/2023 (Originally 07/27/2023)   Zoster Vaccines- Shingrix (2 of 2) 11/01/2023 (Originally 09/24/2022)   Colonoscopy  07/31/2024 (Originally 05/04/2023)   FOOT EXAM  08/17/2023   HEMOGLOBIN A1C  09/08/2023   Lung Cancer Screening  03/07/2024   Diabetic kidney evaluation - eGFR measurement  03/07/2024   Medicare Annual Wellness (AWV)  07/31/2024   DTaP/Tdap/Td (2 - Td or Tdap) 11/02/2025   Pneumonia Vaccine 33+ Years old  Completed   Hepatitis C Screening  Completed   HPV VACCINES  Aged Out       Assessment  This is a routine wellness examination for [employer].  Health Maintenance: Due or Overdue Health Maintenance Due  Topic Date Due   Diabetic kidney evaluation - Urine ACR  08/17/2023    Richard Fuller does not need a referral for Community Assistance: Care Management:   no Social Work:    no Prescription Assistance:  no Nutrition/Diabetes Education:  no   Plan:  Personalized Goals  Goals Addressed               This Visit's Progress     Patient Stated  (pt-stated)        08/01/2023 AWV Goal: Exercise for General Health  Patient will verbalize understanding of the benefits of increased physical activity: Exercising regularly is important. It will improve your overall fitness, flexibility, and endurance. Regular exercise also will improve your overall health. It can help you control your weight, reduce stress, and improve your bone density. Over the next year, patient will increase physical activity as tolerated with a goal of at least 150 minutes of moderate physical activity per week.  You can tell that you are exercising at a moderate intensity if your heart starts beating faster and you start breathing faster but can still hold a conversation. Moderate-intensity exercise ideas include: Walking 1 mile (1.6 km) in about 15 minutes Biking Hiking Golfing Dancing Water aerobics Patient will verbalize understanding of everyday activities that increase physical activity by providing examples like the following: Yard work, such as: Insurance underwriter Gardening Washing windows or floors Patient will be able to explain general safety guidelines for exercising:  Before you start a new exercise program, talk with your health care provider. Do not exercise so much that you hurt yourself, feel dizzy, or get very short of breath. Wear comfortable clothes  and wear shoes with good support. Drink plenty of water while you exercise to prevent dehydration or heat stroke. Work out until your breathing and your heartbeat get faster.        Personalized Health Maintenance & Screening Recommendations  Diabetic eye exam- completed in July; will have records sent.  Colonoscopy - patient will call gastro office at [facility] in [place] and schedule. 2nd dose of shingles vaccine- will confirm at the pharmacy if he already completed it.  Lung  Cancer Screening Recommended: yes; up to date (Low Dose CT Chest recommended if Age 52-80 years, 20 pack-year currently smoking OR have quit w/in past 15 years) Hepatitis C Screening recommended: no HIV Screening recommended: no  Advanced Directives: Written information was not prepared per patient's request.  Referrals & Orders No orders of the defined types were placed in this encounter.   Follow-up Plan Follow-up with Richard Butter, NP as planned Medicare wellness visit in one year.  Patient will access AVS on my chart.   I have personally reviewed and noted the following in the patient's chart:   Medical and social history Use of alcohol, tobacco or illicit drugs  Current medications and supplements Functional ability and status Nutritional status Physical activity Advanced directives List of other physicians Hospitalizations, surgeries, and ER visits in previous 12 months Vitals Screenings to include cognitive, depression, and falls Referrals and appointments  In addition, I have reviewed and discussed with Richard Fuller certain preventive protocols, quality metrics, and best practice recommendations. A written personalized care plan for preventive services as well as general preventive health recommendations is available and can be mailed to the patient at his request.      Modesto Charon, RN BSN  08/01/2023

## 2023-08-01 NOTE — Telephone Encounter (Signed)
Patient requested a refill for Lotrisone cream during his annual wellness visit. He stated he already called the pharmacy.

## 2023-08-01 NOTE — Telephone Encounter (Signed)
Refill sent.

## 2023-08-15 ENCOUNTER — Other Ambulatory Visit: Payer: Self-pay | Admitting: Medical-Surgical

## 2023-09-01 ENCOUNTER — Other Ambulatory Visit: Payer: Self-pay | Admitting: Medical-Surgical

## 2023-09-08 ENCOUNTER — Encounter: Payer: Self-pay | Admitting: Medical-Surgical

## 2023-09-08 ENCOUNTER — Ambulatory Visit (INDEPENDENT_AMBULATORY_CARE_PROVIDER_SITE_OTHER): Payer: Medicare HMO | Admitting: Medical-Surgical

## 2023-09-08 VITALS — BP 145/83 | HR 73 | Resp 20 | Ht 72.0 in | Wt 193.0 lb

## 2023-09-08 DIAGNOSIS — M069 Rheumatoid arthritis, unspecified: Secondary | ICD-10-CM | POA: Diagnosis not present

## 2023-09-08 DIAGNOSIS — Z23 Encounter for immunization: Secondary | ICD-10-CM | POA: Diagnosis not present

## 2023-09-08 DIAGNOSIS — E1151 Type 2 diabetes mellitus with diabetic peripheral angiopathy without gangrene: Secondary | ICD-10-CM

## 2023-09-08 DIAGNOSIS — M722 Plantar fascial fibromatosis: Secondary | ICD-10-CM

## 2023-09-08 DIAGNOSIS — I472 Ventricular tachycardia, unspecified: Secondary | ICD-10-CM | POA: Diagnosis not present

## 2023-09-08 DIAGNOSIS — I10 Essential (primary) hypertension: Secondary | ICD-10-CM | POA: Diagnosis not present

## 2023-09-08 DIAGNOSIS — E782 Mixed hyperlipidemia: Secondary | ICD-10-CM | POA: Diagnosis not present

## 2023-09-08 DIAGNOSIS — E559 Vitamin D deficiency, unspecified: Secondary | ICD-10-CM

## 2023-09-08 DIAGNOSIS — Z7984 Long term (current) use of oral hypoglycemic drugs: Secondary | ICD-10-CM

## 2023-09-08 DIAGNOSIS — K047 Periapical abscess without sinus: Secondary | ICD-10-CM

## 2023-09-08 LAB — POCT UA - MICROALBUMIN
Creatinine, POC: 100 mg/dL
Microalbumin Ur, POC: 80 mg/L

## 2023-09-08 LAB — POCT GLYCOSYLATED HEMOGLOBIN (HGB A1C)
HbA1c, POC (controlled diabetic range): 6.6 % (ref 0.0–7.0)
Hemoglobin A1C: 6.6 % — AB (ref 4.0–5.6)

## 2023-09-08 MED ORDER — AMOXICILLIN-POT CLAVULANATE 875-125 MG PO TABS
1.0000 | ORAL_TABLET | Freq: Two times a day (BID) | ORAL | 0 refills | Status: DC
Start: 2023-09-08 — End: 2023-12-11

## 2023-09-08 MED ORDER — HYDROCODONE-ACETAMINOPHEN 7.5-325 MG PO TABS
1.0000 | ORAL_TABLET | Freq: Three times a day (TID) | ORAL | 0 refills | Status: DC | PRN
Start: 2023-09-08 — End: 2023-12-21

## 2023-09-08 NOTE — Progress Notes (Signed)
Established patient visit  History, exam, impression, and plan:  1. Diabetes mellitus with peripheral vascular disease [facility]) Pleasant 67 year old male presenting today to follow-up on type 2 diabetes with peripheral vascular disease.  Has been taking metformin 750 mg daily, tolerating well without side effects.  His last hemoglobin A1c was 6.3% in March indicating excellent control.  Up-to-date on his eye exam.  Due for foot exam however patient is having severe bilateral foot pain today so this was deferred.  POCT microalbumin was moderately abnormal.  Plan to recheck at his next visit and if it continues to be elevated, plan to discuss further interventions.  Recheck of A1c today at 6.6% indicating glucose is still well-controlled.  Continue metformin as prescribed. - POCT HgB A1C - POCT UA - Microalbumin  2. Primary hypertension Taking amlodipine 5 mg daily along with carvedilol 25 mg twice daily, tolerating well without side effects.  Not regularly checking blood pressures at home.  Blood pressure is elevated today on arrival and remains so on recheck however he is in quite a bit of pain with bilateral foot pain and a dental abscess.  Continue current medications at current doses as he was previously well-controlled.  Monitor blood pressure at home with a goal of 130/80 or less.  3. Rheumatoid arthritis, involving unspecified site, unspecified whether rheumatoid factor present [facility]) Not following with rheumatology.  Symptoms are fairly well-controlled with gabapentin 600 mg twice daily and as needed use of muscle relaxers.  4. Ventricular tachycardia (HCC) History of ventricular tachycardia however this has been resolved.  He has a normal heart rate, normal rhythm.  No PVCs or tachycardia noted.  HRRR, S1/S2 normal.  No murmurs, gallops, or rubs.  Lungs CTA, respirations even and unlabored.  5. Mixed hyperlipidemia Currently taking lovastatin 10 mg daily and prescribed fenofibrate 48  mg daily.  Unfortunately has not been able to obtain the fenofibrate because it is no longer free with his insurance.  Suspect that he is entering the donut hole for Medicare and this will be an out-of-pocket cost.  Plan to continue lovastatin as prescribed.  If monetarily possible, pick up fenofibrate to get restarted on that for adequate control.  6. Vitamin D deficiency History of vitamin D deficiency but up-to-date on vitamin D checks.    7. Need for influenza vaccination Flu shot given in office today. - Flu Vaccine Trivalent High Dose (Fluad)  8.  Plantar fasciitis Patient reports approximately 1 week of severe bilateral foot pain on the plantar surface and heels.  Reports this is worse in the morning when he first gets out of bed and when he gets up after sitting for a while.  Pain is worse when he goes barefoot.  On evaluation, symptoms consistent with bilateral plantar fasciitis.  He cannot tolerate prednisone or anti-inflammatories so plan to treat with conservative measures.  Home exercises printed and provided with AVS for his review.  Discussed some of the conservative measures that may help with his foot pain.  If no improvement over the next 4 to 6 weeks with conservative measures, return for further evaluation with Dr. Karie Schwalbe.  9.  Dental abscess Patient reports 1-1-1/2 weeks of right jaw pain from a dental abscess.  Has significant swelling along the right aspect of the lower jaw.  Does not have a dentist set up but is trying to get in with 1.  Has not noticed any drainage coming from the area but does have significant  tenderness and pain.  The tooth in question is cracked and has a large central cavity.  Ultimately, needs to get in with a dentist for extraction however we will go ahead and treat the abscess today with Augmentin twice daily x 7 days.  Adding moderate dose hydrocodone every 8 hours as needed for pain control.  Procedures performed this visit: None.  Return in about 6  months (around 03/07/2024) for DM/HTN/HLD follow up.  __________________________________ Thayer Ohm, DNP, APRN, FNP-BC Primary Care and Sports Medicine [facility] [place]

## 2023-09-10 ENCOUNTER — Other Ambulatory Visit: Payer: Self-pay | Admitting: Medical-Surgical

## 2023-09-27 ENCOUNTER — Other Ambulatory Visit: Payer: Self-pay | Admitting: Medical-Surgical

## 2023-09-27 DIAGNOSIS — R6884 Jaw pain: Secondary | ICD-10-CM | POA: Diagnosis not present

## 2023-09-27 DIAGNOSIS — R600 Localized edema: Secondary | ICD-10-CM | POA: Diagnosis not present

## 2023-09-27 DIAGNOSIS — K122 Cellulitis and abscess of mouth: Secondary | ICD-10-CM | POA: Diagnosis not present

## 2023-10-04 ENCOUNTER — Other Ambulatory Visit: Payer: Self-pay

## 2023-10-04 MED ORDER — METFORMIN HCL ER 750 MG PO TB24: 750.0000 mg | ORAL_TABLET | Freq: Every day | ORAL | 1 refills | Status: DC

## 2023-10-11 ENCOUNTER — Other Ambulatory Visit: Payer: Self-pay | Admitting: Medical-Surgical

## 2023-10-11 MED ORDER — CARVEDILOL 25 MG PO TABS: 25.0000 mg | ORAL_TABLET | Freq: Every day | ORAL | 1 refills | Status: DC

## 2023-10-11 MED ORDER — FENOFIBRATE 48 MG PO TABS: 48.0000 mg | ORAL_TABLET | Freq: Every day | ORAL | 1 refills | Status: DC

## 2023-10-12 ENCOUNTER — Other Ambulatory Visit: Payer: Self-pay

## 2023-10-12 DIAGNOSIS — M961 Postlaminectomy syndrome, not elsewhere classified: Secondary | ICD-10-CM

## 2023-10-12 MED ORDER — GABAPENTIN 300 MG PO CAPS: 600.0000 mg | ORAL_CAPSULE | Freq: Two times a day (BID) | ORAL | 3 refills | Status: DC

## 2023-10-24 ENCOUNTER — Other Ambulatory Visit: Payer: Self-pay | Admitting: Medical-Surgical

## 2023-11-19 ENCOUNTER — Other Ambulatory Visit: Payer: Self-pay | Admitting: Medical-Surgical

## 2023-11-20 MED ORDER — AMLODIPINE BESYLATE 5 MG PO TABS: 5.0000 mg | ORAL_TABLET | Freq: Every day | ORAL | 0 refills | Status: DC

## 2023-12-07 ENCOUNTER — Ambulatory Visit: Payer: Self-pay | Admitting: Medical-Surgical

## 2023-12-07 NOTE — Telephone Encounter (Signed)
Copied from CRM [id number]. Topic: Clinical - Red Word Triage >> Dec 07, 2023  2:26 PM Almira Coaster wrote: Red Word that prompted transfer to Nurse Triage: Patient's wife called to inform us that patient has been throwing up, diarrhea and stomach pain for the last couple of days. Patient was not on the line he's currently at home and wife is at work. Please call patient.  Chief Complaint: Generalized sickness Symptoms: Runny nose/congestion Frequency: x 2 days Pertinent Negatives: Patient denies Nausea/vomiting/diarrhea, no headaches, no chest pain, no abdominal pain Disposition: [] ED /[] Urgent Care (no appt availability in office) / [] Appointment(In office/virtual)/ []  [facility] [facility]/ [x] Home Care/ [] Refused Recommended Disposition /[] [place] [facility]/ []  Follow-up with PCP Additional Notes: Patient's wife had initially called from her work about the patient and wanted [place] to give him a call and triage him.  Patient is called and he stated that around Thanksgiving he had nausea/vomiting/diarrhea for two days and it went away.  He adds that 2 days ago he had an episode of nausea/vomiting/diarrhea that went away.  At this time, patient states his only symptoms are some runny nose/congestion and generalized malaise.  Patient states that this is how he felt when he had COVID.  He said that he had some chills and this morning he woke up sweating and  took some over the counter Tylenol and he felt better.  He stated he did have a headache this morning but that went away after taking over the counter Tylenol.  He denies any chest pain, abdominal pain, nausea, vomiting, diarrhea,ear aches, headaches or difficulty breathing.  He said his phlegm was green this morning but cleared up later on in the day.  Patient states that he feels better and he wants to wait and see how he feels and if things get worse he will call us to make an appointment.  This RN advised the patient of home care instructions and advised  him if anything worsened for him to call us back or go to an urgent care or to the emergency room.  Patient verbalized understanding.  Reason for Disposition  Common cold with no complications  Answer Assessment - Initial Assessment Questions 1. ONSET: "When did the nasal discharge start?"      Two days ago 2. AMOUNT: "How much discharge is there?"      "Not a lot" 3. COUGH: "Do you have a cough?" If Yes, ask: "Describe the color of your sputum" (clear, white, yellow, green)     "Not really" "Sputum is clear now but was green this morning" 4. RESPIRATORY DISTRESS: "Describe your breathing."      "Breathing is fine but this feels like COVID" 5. FEVER: "Do you have a fever?" If Yes, ask: "What is your temperature, how was it measured, and when did it start?"     Unknown 6. SEVERITY: "Overall, how bad are you feeling right now?" (e.g., doesn't interfere with normal activities, staying home from school/work, staying in bed)      Patient states he woke up sweating today, took some cold and flu medicine, and then he felt better.   7. OTHER SYMPTOMS: "Do you have any other symptoms?" (e.g., sore throat, earache, wheezing, vomiting)     Patient states during Thanksgiving he had nausea/vomiting/diarrhea that lasted 2-3 days then he was fine.  Patient states two days ago he had nausea/vomiting/diarrhea again and felt like he had cold chills.  Protocols used: Common Cold-A-AH

## 2023-12-11 ENCOUNTER — Encounter: Payer: Self-pay | Admitting: Medical-Surgical

## 2023-12-11 ENCOUNTER — Ambulatory Visit (INDEPENDENT_AMBULATORY_CARE_PROVIDER_SITE_OTHER): Payer: Medicare HMO | Admitting: Medical-Surgical

## 2023-12-11 ENCOUNTER — Ambulatory Visit: Payer: Medicare HMO

## 2023-12-11 ENCOUNTER — Ambulatory Visit: Payer: Self-pay | Admitting: Medical-Surgical

## 2023-12-11 VITALS — BP 145/85 | HR 77 | Resp 20 | Ht 72.0 in | Wt 186.1 lb

## 2023-12-11 DIAGNOSIS — R0989 Other specified symptoms and signs involving the circulatory and respiratory systems: Secondary | ICD-10-CM

## 2023-12-11 DIAGNOSIS — J329 Chronic sinusitis, unspecified: Secondary | ICD-10-CM

## 2023-12-11 DIAGNOSIS — J40 Bronchitis, not specified as acute or chronic: Secondary | ICD-10-CM | POA: Diagnosis not present

## 2023-12-11 DIAGNOSIS — R051 Acute cough: Secondary | ICD-10-CM

## 2023-12-11 MED ORDER — AZITHROMYCIN 250 MG PO TABS: ORAL_TABLET | ORAL | 0 refills | Status: AC

## 2023-12-11 MED ORDER — AMOXICILLIN-POT CLAVULANATE 875-125 MG PO TABS: 1.0000 | ORAL_TABLET | Freq: Two times a day (BID) | ORAL | 0 refills | Status: DC

## 2023-12-11 NOTE — Telephone Encounter (Signed)
Copied from CRM [id number]. Topic: Clinical - Red Word Triage >> Dec 11, 2023  8:25 AM Larwance Sachs wrote: Red Word that prompted transfer to Nurse Triage: Patient called in regarding being sick for about 3 ongoing weeks, stated he is having night sweat and a cold. Started off with diarrhea  that last about 2/3 days then turned into cold. Diarrhea is beginning to come back. Patient would like an appointment with provider unless something can be sent into pharmacy Also mentioned trying tylenol pm, robitussin and other over the counter meds did not help   Chief Complaint: URI symptoms Symptoms: Productive cough, night sweats, diarrhea, fatigue, chest tightness, weight loss of 5-6 lbs Frequency: Occasional cough, since 3 weeks ago Pertinent Negatives: Patient denies chest pain, trouble breathing Disposition: [] ED /[] Urgent Care (no appt availability in office) / [x] Appointment(In office/virtual)/ []  [place] [facility]/ [] Home Care/ [] Refused Recommended Disposition /[] [place] [facility]/ []  Follow-up with PCP Additional Notes: Pt reporting experiencing symptoms for past 3 weeks, started with diarrhea 2-3 days, diarrhea coming back "a little bit," having "night sweats, have to get up and change clothes at night," interrupted sleep, feeling "out of it," coughing "a little bit" to "dark green" phlegm, lost "probably 5-6 lbs" in this time of illness, and "feels like might have pneumonia." Pt reporting that he "felt like was getting better," but symptoms getting worse again. Pt unsure if fever, does not take temp, denies chest pain and trouble breathing. Advised pt be seen in office today for appt, pt agreed, scheduled for this afternoon. Advised to utilize tylenol/motrin if discomfort with temp, advised to take temp, advised drink lots of water to assist loosening up phlegm. Pt verbalized understanding to go to ED if trouble breathing or chest pain.  Reason for Disposition  [1] Fever returns after gone  for over 24 hours AND [2] symptoms worse or not improved  Answer Assessment - Initial Assessment Questions 1. ONSET: "When did the cough begin?"      Pt reporting that been coughing intermittently for 3 weeks 2. SEVERITY: "How bad is the cough today?"      Pt reporting he is coughing just "a little bit" here and there, denies trouble breathing 3. SPUTUM: "Describe the color of your sputum" (none, dry cough; clear, white, yellow, green)     Pt reporting "dark green" phlegm 4. HEMOPTYSIS: "Are you coughing up any blood?" If so ask: "How much?" (flecks, streaks, tablespoons, etc.)     Pt denies 5. DIFFICULTY BREATHING: "Are you having difficulty breathing?" If Yes, ask: "How bad is it?" (e.g., mild, moderate, severe)    - MILD: No SOB at rest, mild SOB with walking, speaks normally in sentences, can lie down, no retractions, pulse < 100.    - MODERATE: SOB at rest, SOB with minimal exertion and prefers to sit, cannot lie down flat, speaks in phrases, mild retractions, audible wheezing, pulse 100-120.    - SEVERE: Very SOB at rest, speaks in single words, struggling to breathe, sitting hunched forward, retractions, pulse > 120      Pt reporting "chest is just tight," but denies trouble breathing 6. FEVER: "Do you have a fever?" If Yes, ask: "What is your temperature, how was it measured, and when did it start?"     Pt has not measured temperature, but been having "night sweats, have to get up and change clothes at night" for past 3 weeks 7. CARDIAC HISTORY: "Do you have any history of heart disease?" (e.g.,  heart attack, congestive heart failure)      Pt has hx of hypertension 10. OTHER SYMPTOMS: "Do you have any other symptoms?" (e.g., runny nose, wheezing, chest pain)       Pt reporting illness began with 2-3 days of diarrhea, having "little bit" of it now, he "feels like might have pneumonia," "couldn't sleep" last night, interrupted sleep, night sweats, "just feel out of it," pt reporting he has  lot "probably 5-6 lbs" over the course of this illness. Pt reporting he felt like he was getting better but symptoms getting worse again.  Protocols used: Cough - Acute Productive-A-AH

## 2023-12-11 NOTE — Progress Notes (Signed)
        Established patient visit  History, exam, impression, and plan:  1. Sinobronchitis (Primary) 2. Acute cough Pleasant 67 year old male presenting today with reports of 3 weeks of being sick.  His symptoms originally occurred just before Thanksgiving and included nausea, vomiting, and diarrhea.  This lasted for a few days before finally starting to improve.  He felt like he was getting better however he then developed upper respiratory symptoms, sinus congestion, sinus pressure, rhinorrhea, headache, fever/chills, night sweats, chest tightness, and discomfort along the lower ribs wrapping it in a bandlike formation around his chest.  He has been trying to get better with over-the-counter medications and home remedies however this has not improved.  He is now weak and lethargic.  Having difficulty sleeping at night.  Cough is now productive of thick green sputum that is difficult to expectorate.  On exam, he does have diminished breath sounds throughout.  HRRR, S1/S2 normal.  Visibly ill appearing.  Maxillary sinus tenderness bilaterally.  Significant concern for possible.  Chest x-ray today.  Treating empirically with Augmentin twice daily x 10 days.  Adding azithromycin for atypical coverage.  Discussed possible prednisone burst but he would like to hold off as he does not like how prednisone makes him feel. - DG Chest 2 View; Future  Procedures performed this visit: None.  Return in about 6 weeks (around 01/22/2024) for URI follow up.  __________________________________ Thayer Ohm, DNP, APRN, FNP-BC Primary Care and Sports Medicine [facility] [place]

## 2023-12-12 ENCOUNTER — Ambulatory Visit: Payer: Self-pay | Admitting: Medical-Surgical

## 2023-12-12 NOTE — Telephone Encounter (Signed)
Patient seen in office 12/11/23.

## 2023-12-12 NOTE — Telephone Encounter (Signed)
Copied from CRM [id number]. Topic: Clinical - Red Word Triage >> Dec 12, 2023 10:32 AM Cassiday T wrote: Red Word that prompted transfer to Nurse Triage: patient is on antibiotic    and he is feeling lethargic  and breaking out in a sweat he was told by the dr to go to the er if this happen he wanted to know if he needed to go to the er  Chief Complaint: sweating; possible medication reaction Symptoms: sweating after starting antibiotic yesterday for chest congestion Frequency: started last night Pertinent Negatives: Patient denies fever, pain Disposition: [] ED /[] Urgent Care (no appt availability in office) / [] Appointment(In office/virtual)/ []  [place] [facility]/ [x] Home Care/ [] Refused Recommended Disposition /[] [place] [facility]/ []  Follow-up with PCP Additional Notes: Patient reports seeing PCP yesterday and starting on antibiotics for chest congestion.  States he had cxr yesterday.  Reports starting azithromycin and Amox/Clav and starting sweating profusely. Instructed to continue to take antibiotics as prescribed and to drink extra fluids.  Instructed to go to ER if becomes worse.  Message sent to PCP re: "sweating".  Reason for Disposition  Caller has medicine question, adult has minor symptoms, caller declines triage, AND triager answers question  Answer Assessment - Initial Assessment Questions 1. NAME of MEDICINE: "What medicine(s) are you calling about?"     Azithromycin 250 mg and Amox/Clav 875 mg 2. QUESTION: "What is your question?" (e.g., double dose of medicine, side effect)     PCP told them to go to er if becomes lethargic.  4. SYMPTOMS: "Do you have any symptoms?" If Yes, ask: "What symptoms are you having?"  "How bad are the symptoms (e.g., mild, moderate, severe)     Sweating "soaking wet"  Protocols used: Medication Question Call-A-AH

## 2023-12-13 DIAGNOSIS — K5730 Diverticulosis of large intestine without perforation or abscess without bleeding: Secondary | ICD-10-CM | POA: Diagnosis not present

## 2023-12-13 DIAGNOSIS — N281 Cyst of kidney, acquired: Secondary | ICD-10-CM | POA: Diagnosis not present

## 2023-12-13 DIAGNOSIS — N400 Enlarged prostate without lower urinary tract symptoms: Secondary | ICD-10-CM | POA: Diagnosis not present

## 2023-12-13 DIAGNOSIS — N3289 Other specified disorders of bladder: Secondary | ICD-10-CM | POA: Diagnosis not present

## 2023-12-13 DIAGNOSIS — K859 Acute pancreatitis without necrosis or infection, unspecified: Secondary | ICD-10-CM | POA: Diagnosis not present

## 2023-12-13 DIAGNOSIS — R9431 Abnormal electrocardiogram [ECG] [EKG]: Secondary | ICD-10-CM | POA: Diagnosis not present

## 2023-12-13 DIAGNOSIS — R0789 Other chest pain: Secondary | ICD-10-CM | POA: Diagnosis not present

## 2023-12-13 DIAGNOSIS — R079 Chest pain, unspecified: Secondary | ICD-10-CM | POA: Diagnosis not present

## 2023-12-13 DIAGNOSIS — I10 Essential (primary) hypertension: Secondary | ICD-10-CM | POA: Diagnosis not present

## 2023-12-13 DIAGNOSIS — I7121 Aneurysm of the ascending aorta, without rupture: Secondary | ICD-10-CM | POA: Diagnosis not present

## 2023-12-13 DIAGNOSIS — E785 Hyperlipidemia, unspecified: Secondary | ICD-10-CM | POA: Diagnosis not present

## 2023-12-13 DIAGNOSIS — F17210 Nicotine dependence, cigarettes, uncomplicated: Secondary | ICD-10-CM | POA: Diagnosis not present

## 2023-12-13 DIAGNOSIS — E119 Type 2 diabetes mellitus without complications: Secondary | ICD-10-CM | POA: Diagnosis not present

## 2023-12-13 DIAGNOSIS — J9811 Atelectasis: Secondary | ICD-10-CM | POA: Diagnosis not present

## 2023-12-13 DIAGNOSIS — K219 Gastro-esophageal reflux disease without esophagitis: Secondary | ICD-10-CM | POA: Diagnosis not present

## 2023-12-13 DIAGNOSIS — K3189 Other diseases of stomach and duodenum: Secondary | ICD-10-CM | POA: Diagnosis not present

## 2023-12-13 NOTE — Telephone Encounter (Signed)
Spoke with patient states was seen at [facility] last night as still was sweating - had CT of chest and abdomen , normal results. Was found that pancreas was swollen and diagnosed wit pancreatitis and dehydration- given IV for dehydration - had x-ray of heart- states when he felt the same way in past and was dx with arrhythmia - given carvediol for this - was currently taking 12.5 in a.m.( 1/2 tablet) and 12/5 p.m. ( 1/2 tablet ) per day - states last night he took 3/4 tablet and felt somewhat better.

## 2023-12-14 ENCOUNTER — Telehealth: Payer: Self-pay

## 2023-12-14 NOTE — Transitions of Care (Post Inpatient/ED Visit) (Signed)
12/14/2023  Name: Richard Fuller MRN: [id number] DOB: 1956-02-26  Today's TOC FU Call Status: Today's TOC FU Call Status:: Successful TOC FU Call Completed TOC FU Call Complete Date: 12/14/23 Patient's Name and Date of Birth confirmed.  Transition Care Management Follow-up Telephone Call Date of Discharge: 12/13/23 Discharge Facility: Other ([facility]) Name of Other ([facility]) Discharge Facility: [facility] Type of Discharge: Emergency Department Reason for ED Visit: Other: (chest pain) How have you been since you were released from the hospital?: Better Any questions or concerns?: No  Items Reviewed: Did you receive and understand the discharge instructions provided?: Yes Medications obtained,verified, and reconciled?: Yes (Medications Reviewed) Any new allergies since your discharge?: No Dietary orders reviewed?: Yes Do you have support at home?: Yes People in Home: spouse  Medications Reviewed Today: Medications Reviewed Today     Reviewed by Karena Addison, LPN (Licensed Practical Nurse) on 12/14/23 at 1512  Med List Status: <None>   Medication Order Taking? Sig Documenting Provider Last Dose Status Informant  acetaminophen (TYLENOL) 325 MG tablet [id number] Yes Take 325 mg by mouth every 4 (four) hours as needed. [provider] Taking Active   alum & mag hydroxide-simeth (MAALOX MAX) 400-400-40 MG/5ML suspension [id number] Yes Take 5 mLs by mouth as needed. [provider] Taking Active   amLODipine (NORVASC) 5 MG tablet [id number] Yes Take 1 tablet (5 mg total) by mouth daily. Everrett Coombe, DO Taking Active   amoxicillin-clavulanate (AUGMENTIN) 875-125 MG tablet [id number] No Take 1 tablet by mouth 2 (two) times daily.  Patient not taking: Reported on 12/14/2023   Christen Butter, NP Not Taking Active   ASPIRIN LOW DOSE 81 MG tablet [id number] Yes Take 1 tablet (81 mg total) by mouth daily. Christen Butter, NP Taking Active   azithromycin (ZITHROMAX) 250 MG  tablet [id number] No Take 2 tablets on day 1, then 1 tablet daily on days 2 through 5  Patient not taking: Reported on 12/14/2023   Christen Butter, NP Not Taking Active   carvedilol (COREG) 25 MG tablet [id number] Yes Take 1 tablet (25 mg total) by mouth daily. Christen Butter, NP Taking Active   clotrimazole-betamethasone (LOTRISONE) cream [id number] Yes Apply 1 Application topically daily. Christen Butter, NP Taking Active   cyclobenzaprine (FLEXERIL) 10 MG tablet [id number] Yes Take 1 tablet (10 mg total) by mouth 3 (three) times daily as needed for muscle spasms. Christen Butter, NP Taking Active   enalapril (VASOTEC) 20 MG tablet [id number] Yes TAKE 1 TABLET(20 MG) BY MOUTH TWICE DAILY Christen Butter, NP Taking Active   escitalopram (LEXAPRO) 10 MG tablet [id number] Yes Take 1 tablet (10 mg total) by mouth daily. Christen Butter, NP Taking Active   famotidine (PEPCID) 20 MG tablet [id number] Yes Take 20 mg by mouth 2 (two) times daily. [provider] Taking Active   fenofibrate (TRICOR) 48 MG tablet [id number] Yes Take 1 tablet (48 mg total) by mouth daily. Christen Butter, NP Taking Active   gabapentin (NEURONTIN) 300 MG capsule [id number] Yes Take 2 capsules (600 mg total) by mouth 2 (two) times daily. Christen Butter, NP Taking Active   HYDROcodone-acetaminophen (NORCO) 7.5-325 MG tablet [id number] Yes Take 1 tablet by mouth every 8 (eight) hours as needed for moderate pain (cough). Christen Butter, NP Taking Active   lovastatin (MEVACOR) 10 MG tablet [id number] Yes TAKE 1 TABLET(10 MG) BY MOUTH AT BEDTIME Christen Butter, NP Taking Active   metFORMIN (GLUCOPHAGE-XR) 750 MG 24 hr tablet [id number] Yes Take 1 tablet (750 mg total) by mouth  daily with breakfast. Christen Butter, NP Taking Active   pantoprazole (PROTONIX) 20 MG tablet [id number] Yes Take 1 tablet (20 mg total) by mouth daily. Christen Butter, NP Taking Active   zolpidem (AMBIEN) 5 MG tablet [id number] Yes Take 1 tablet (5 mg total) by mouth at bedtime as needed for sleep.  Christen Butter, NP Taking Active             Home Care and Equipment/Supplies: Were Home Health Services Ordered?: NA Any new equipment or medical supplies ordered?: NA  Functional Questionnaire: Do you need assistance with bathing/showering or dressing?: No Do you need assistance with meal preparation?: No Do you need assistance with eating?: No Do you have difficulty maintaining continence: No Do you need assistance with getting out of bed/getting out of a chair/moving?: No Do you have difficulty managing or taking your medications?: No  Follow up appointments reviewed: PCP Follow-up appointment confirmed?: Yes Date of PCP follow-up appointment?: 12/21/23 Follow-up Provider: [facility] Follow-up appointment confirmed?: NA Do you need transportation to your follow-up appointment?: No Do you understand care options if your condition(s) worsen?: Yes-patient verbalized understanding    SIGNATURE Karena Addison, LPN [facility] Nurse Health Advisor Direct Dial [phone]

## 2023-12-17 ENCOUNTER — Other Ambulatory Visit: Payer: Self-pay | Admitting: Medical-Surgical

## 2023-12-21 ENCOUNTER — Encounter: Payer: Self-pay | Admitting: Medical-Surgical

## 2023-12-21 ENCOUNTER — Ambulatory Visit (INDEPENDENT_AMBULATORY_CARE_PROVIDER_SITE_OTHER): Payer: Medicare HMO | Admitting: Medical-Surgical

## 2023-12-21 ENCOUNTER — Ambulatory Visit: Payer: Medicare HMO | Attending: Medical-Surgical

## 2023-12-21 ENCOUNTER — Telehealth: Payer: Self-pay | Admitting: Medical-Surgical

## 2023-12-21 VITALS — BP 141/72 | HR 74 | Resp 20 | Ht 72.0 in | Wt 172.0 lb

## 2023-12-21 DIAGNOSIS — K859 Acute pancreatitis without necrosis or infection, unspecified: Secondary | ICD-10-CM | POA: Diagnosis not present

## 2023-12-21 DIAGNOSIS — B353 Tinea pedis: Secondary | ICD-10-CM | POA: Diagnosis not present

## 2023-12-21 DIAGNOSIS — R011 Cardiac murmur, unspecified: Secondary | ICD-10-CM

## 2023-12-21 DIAGNOSIS — Z09 Encounter for follow-up examination after completed treatment for conditions other than malignant neoplasm: Secondary | ICD-10-CM | POA: Diagnosis not present

## 2023-12-21 DIAGNOSIS — R002 Palpitations: Secondary | ICD-10-CM | POA: Diagnosis not present

## 2023-12-21 DIAGNOSIS — R600 Localized edema: Secondary | ICD-10-CM | POA: Diagnosis not present

## 2023-12-21 MED ORDER — FAMOTIDINE 20 MG PO TABS: 20.0000 mg | ORAL_TABLET | Freq: Two times a day (BID) | ORAL | 5 refills | Status: DC

## 2023-12-21 MED ORDER — IBUPROFEN 600 MG PO TABS: 600.0000 mg | ORAL_TABLET | Freq: Three times a day (TID) | ORAL | 3 refills | Status: DC | PRN

## 2023-12-21 MED ORDER — TERBINAFINE HCL 250 MG PO TABS: 250.0000 mg | ORAL_TABLET | Freq: Every day | ORAL | 1 refills | Status: DC

## 2023-12-21 NOTE — Progress Notes (Unsigned)
EP to read

## 2023-12-21 NOTE — Progress Notes (Signed)
        Established patient visit  History, exam, impression, and plan:  1. Hospital discharge follow-up (Primary) 2. Acute pancreatitis, unspecified complication status, unspecified pancreatitis type Pleasant 67 year old male presenting today for follow-up after hospital ED discharge.  He has been recently treated with Augmentin and azithromycin for URI and suspected pneumonia.  He called the next day reporting that he was sweating profusely.  Chest x-ray report came back showing no pneumonia so he was instructed to discontinue azithromycin as this was possibly contributing to his diaphoresis.  On 12/13/2023, he reported to the emergency room with reports of increased sweating and chest pain.  His evaluation there was unrevealing although his pancreatic enzymes were elevated.  He was suspected to have acute pancreatitis and was discharged home with supportive care recommendations.  On review of laboratory results, his lipase was 117, hemoglobin of 12.9, and WBC 6.7.  Cardiac workup normal.  CT abdomen pelvis showing findings consistent with colitis/pancolitis.  Today, he reports that he is feeling better and getting back to himself.  He does still have some fatigue that seems to be lingering but feels like he is getting better slowly.  Plan to recheck lipase today.  3. Tinea pedis of both feet Reports that he has had issues with tinea pedis of both feet that affects the plantar aspect of the foot along the ball and the toes.  Has been using antifungal cream for a while but it does not seem to get any better.  On evaluation, he does have scattered fungal rashes as noted above.  Liver function was normal on recent lab checks.  Adding terbinafine 250 mg daily for 30 days then plan to reevaluate for need to prolong treatment.  Okay to use antifungal topicals in conjunction with oral medication. - terbinafine (LAMISIL) 250 MG tablet; Take 1 tablet (250 mg total) by mouth daily.  Dispense: 30 tablet; Refill:  1  4. Bilateral lower extremity edema Since getting sick, has noted bilateral lower extremity edema that is worse later in the evening.  Has compression socks that he wears off and on but not regularly.  On evaluation, edema is pitting extending up into the mid shin bilaterally.  Recent labs looked good so suspect this is related to recent illness and poor nutrition with abrupt weight loss.  Recommend conservative measures with low-sodium diet, increase hydration, and compression socks during the day.  At night, elevate legs while seated.  If no improvement in the next couple of weeks with these measures, return for further evaluation.  5. Systolic murmur 6. Palpitations On exam today, he does have a very audible systolic murmur loudest over the right upper sternal border.  Admits that he has episodes at night while lying in bed where he feels like his heart skips a beat or pauses.  At these times, he feels like his heart is pounding.  This has been going on for about a year but he has not had any workup to date.  Recent EKG in the emergency room showed normal sinus rhythm with possible old infarct.  Plan for long-term monitor and echocardiogram today. - LONG TERM MONITOR (3-14 DAYS); Future - ECHOCARDIOGRAM COMPLETE; Future  Procedures performed this visit: None.  Return if symptoms worsen or fail to improve.  __________________________________ Richard Ohm, DNP, APRN, FNP-BC Primary Care and Sports Medicine [facility] [place]

## 2023-12-21 NOTE — Telephone Encounter (Signed)
Copied from CRM [id number]. Topic: Clinical - Medical Advice >> Dec 21, 2023  2:16 PM Victorino Dike T wrote: Reason for CRM: returning call from office, please call patient back [phone] Called patient he is wanting a call back from [place]

## 2023-12-21 NOTE — Telephone Encounter (Signed)
Task completed. Patient was informed of Xray results from 12/11/23. Per patient, he was informed of his results a few days after completing his Xray.

## 2023-12-22 LAB — LIPASE: Lipase: 41 U/L (ref 13–78)

## 2023-12-29 DIAGNOSIS — R002 Palpitations: Secondary | ICD-10-CM | POA: Diagnosis not present

## 2023-12-29 DIAGNOSIS — R011 Cardiac murmur, unspecified: Secondary | ICD-10-CM

## 2024-01-11 ENCOUNTER — Other Ambulatory Visit: Payer: Self-pay

## 2024-01-11 MED ORDER — ESCITALOPRAM OXALATE 10 MG PO TABS: 10.0000 mg | ORAL_TABLET | Freq: Every day | ORAL | 0 refills | Status: DC

## 2024-01-12 ENCOUNTER — Other Ambulatory Visit: Payer: Self-pay | Admitting: Family Medicine

## 2024-01-18 DIAGNOSIS — R011 Cardiac murmur, unspecified: Secondary | ICD-10-CM | POA: Diagnosis not present

## 2024-01-18 DIAGNOSIS — R002 Palpitations: Secondary | ICD-10-CM | POA: Diagnosis not present

## 2024-01-20 ENCOUNTER — Encounter: Payer: Self-pay | Admitting: Medical-Surgical

## 2024-01-24 ENCOUNTER — Other Ambulatory Visit (HOSPITAL_BASED_OUTPATIENT_CLINIC_OR_DEPARTMENT_OTHER): Payer: Medicare HMO

## 2024-01-24 ENCOUNTER — Encounter (HOSPITAL_COMMUNITY): Payer: Self-pay | Admitting: Medical-Surgical

## 2024-01-25 ENCOUNTER — Telehealth: Payer: Medicare HMO | Admitting: Medical-Surgical

## 2024-01-25 ENCOUNTER — Encounter: Payer: Self-pay | Admitting: Medical-Surgical

## 2024-01-25 DIAGNOSIS — U071 COVID-19: Secondary | ICD-10-CM

## 2024-01-25 MED ORDER — NIRMATRELVIR/RITONAVIR (PAXLOVID)TABLET: 3.0000 | ORAL_TABLET | Freq: Two times a day (BID) | ORAL | 0 refills | Status: AC

## 2024-01-25 MED ORDER — PROMETHAZINE-DM 6.25-15 MG/5ML PO SYRP: 5.0000 mL | ORAL_SOLUTION | Freq: Four times a day (QID) | ORAL | 0 refills | Status: DC | PRN

## 2024-01-25 MED ORDER — ONDANSETRON 4 MG PO TBDP: 4.0000 mg | ORAL_TABLET | Freq: Three times a day (TID) | ORAL | 0 refills | Status: DC | PRN

## 2024-01-25 NOTE — Progress Notes (Signed)
Virtual Visit via Video Note  I connected with Richard Fuller on 01/25/24 at  1:20 PM EST by a video enabled telemedicine application and verified that I am speaking with the correct person using two identifiers.   I discussed the limitations of evaluation and management by telemedicine and the availability of in person appointments. The patient expressed understanding and agreed to proceed.  Patient location: home Provider locations: office  Subjective:    CC: Viral symptoms  HPI: Very pleasant 68 year old male presenting via MyChart video visit with reports of viral symptoms that started Tuesday morning.  Reports symptoms including sore throat, postnasal drip, sinus pressure/pain, congestion, nausea with vomiting, cold sweats, headache, and bodyaches.  Having difficulty eating and drinking due to the nausea and vomiting.  Recent exposure to his granddaughter who had the flu and his wife who tested positive for COVID today.  Past medical history, Surgical history, Family history not pertinant except as noted below, Social history, Allergies, and medications have been entered into the medical record, reviewed, and corrections made.   Review of Systems: See HPI for pertinent positives and negatives.   Objective:    General: Speaking clearly in complete sentences without any shortness of breath.  Alert and oriented x3.  Normal judgment. No apparent acute distress.  Impression and Recommendations:    1. COVID-19 virus infection (Primary) Unfortunately, he is unable to come in for point-of-care testing however with recent exposures, plan to go ahead and treat for COVID-19.  Adding Paxlovid twice daily x 5 days.  Sending in Promethazine DM to help with cough as well as nausea.  Also adding Zofran as needed for nausea management.  Discussed recommendations for rest and dietary modification with clear liquids progressing to full liquids and then to bland foods and small quantities on a frequent  basis.  Patient verbalized understanding plan.  Okay to use over-the-counter medications for symptom management however would like for him to aim for Coricidin or other medications geared for patients with high blood pressure. - promethazine-dextromethorphan (PROMETHAZINE-DM) 6.25-15 MG/5ML syrup; Take 5 mLs by mouth 4 (four) times daily as needed.  Dispense: 118 mL; Refill: 0 - nirmatrelvir/ritonavir (PAXLOVID) 20 x 150 MG & 10 x 100MG  TABS; Take 3 tablets by mouth 2 (two) times daily for 5 days. (Take nirmatrelvir 150 mg two tablets twice daily for 5 days and ritonavir 100 mg one tablet twice daily for 5 days) Patient GFR is 83  Dispense: 30 tablet; Refill: 0 - ondansetron (ZOFRAN-ODT) 4 MG disintegrating tablet; Take 1 tablet (4 mg total) by mouth every 8 (eight) hours as needed for nausea or vomiting.  Dispense: 20 tablet; Refill: 0   I discussed the assessment and treatment plan with the patient. The patient was provided an opportunity to ask questions and all were answered. The patient agreed with the plan and demonstrated an understanding of the instructions.   The patient was advised to call back or seek an in-person evaluation if the symptoms worsen or if the condition fails to improve as anticipated.  Return if symptoms worsen or fail to improve.  Thayer Ohm, DNP, APRN, FNP-BC [facility] and Sports Medicine

## 2024-02-02 ENCOUNTER — Other Ambulatory Visit: Payer: Self-pay | Admitting: Medical-Surgical

## 2024-02-02 DIAGNOSIS — M961 Postlaminectomy syndrome, not elsewhere classified: Secondary | ICD-10-CM

## 2024-02-15 ENCOUNTER — Telehealth: Payer: Self-pay

## 2024-02-15 DIAGNOSIS — R002 Palpitations: Secondary | ICD-10-CM

## 2024-02-15 DIAGNOSIS — R011 Cardiac murmur, unspecified: Secondary | ICD-10-CM

## 2024-02-15 NOTE — Telephone Encounter (Signed)
 Copied from CRM [id number]. Topic: Clinical - Medical Advice >> Feb 14, 2024 10:46 AM Nila Nephew wrote: Reason for CRM: Patient is calling to request that PC P give him a cal back. Needs are unclear - states needs MyChart but has MyChart and states talked to PCP already but wants PCP to call him back.

## 2024-02-16 ENCOUNTER — Telehealth: Payer: Self-pay | Admitting: Medical-Surgical

## 2024-02-16 NOTE — Telephone Encounter (Signed)
 Attempted call to patient. Voice mail box not yet set up. Could not leave a voice mail message.

## 2024-02-16 NOTE — Telephone Encounter (Signed)
 Copied from CRM [id number]. Topic: General - Other >> Feb 16, 2024 10:05 AM Nila Nephew wrote: Reason for CRM: Patient returning call for [facility]. Patient requesting a call back.

## 2024-02-16 NOTE — Telephone Encounter (Signed)
 Do not see where echocardiogram was done. Should I reach out to [employer] and see if she can look into this?

## 2024-02-16 NOTE — Telephone Encounter (Signed)
 Patient informed and will call to schedule echocardiogram . Given phone # for scheduling of [phone].

## 2024-02-16 NOTE — Telephone Encounter (Signed)
 Spoke with patient . He had questions regarding his heart.  States he was told that he had a problem with his heart - states he was suppose to getting in with someone but is not sure about this)  he states he had covid that lasted for about 3 to 4 weeks and has felt very fatigued after this and was wondering if related to covid or to his heart. Patient is schld for 03/01/24 and told to call early in morning on Monday to see about scheduling same day appt if he is available to do so. He agrees.

## 2024-02-16 NOTE — Telephone Encounter (Signed)
 The Echo has been ordered and authorized already so it just needs to be scheduled for completion. The fatigue is likely related to covid and the viral effects on the body. It should gradually improve but we will work on getting him in with cardiology asap.

## 2024-02-20 NOTE — Telephone Encounter (Signed)
 Spoke with patient.

## 2024-03-01 ENCOUNTER — Encounter: Payer: Self-pay | Admitting: Medical-Surgical

## 2024-03-01 ENCOUNTER — Ambulatory Visit: Payer: Medicare HMO | Admitting: Medical-Surgical

## 2024-03-01 VITALS — BP 124/69 | HR 68 | Resp 20 | Ht 72.0 in | Wt 193.0 lb

## 2024-03-01 DIAGNOSIS — R011 Cardiac murmur, unspecified: Secondary | ICD-10-CM

## 2024-03-01 DIAGNOSIS — M069 Rheumatoid arthritis, unspecified: Secondary | ICD-10-CM | POA: Diagnosis not present

## 2024-03-01 DIAGNOSIS — I472 Ventricular tachycardia, unspecified: Secondary | ICD-10-CM | POA: Diagnosis not present

## 2024-03-01 DIAGNOSIS — Z7984 Long term (current) use of oral hypoglycemic drugs: Secondary | ICD-10-CM

## 2024-03-01 DIAGNOSIS — E1151 Type 2 diabetes mellitus with diabetic peripheral angiopathy without gangrene: Secondary | ICD-10-CM

## 2024-03-01 LAB — POCT GLYCOSYLATED HEMOGLOBIN (HGB A1C)
HbA1c, POC (controlled diabetic range): 6.9 % (ref 0.0–7.0)
Hemoglobin A1C: 6.9 % — AB (ref 4.0–5.6)

## 2024-03-01 MED ORDER — METFORMIN HCL ER 750 MG PO TB24: 750.0000 mg | ORAL_TABLET | Freq: Every day | ORAL | 3 refills | Status: AC

## 2024-03-01 MED ORDER — ESCITALOPRAM OXALATE 10 MG PO TABS: 10.0000 mg | ORAL_TABLET | Freq: Every day | ORAL | 3 refills | Status: AC

## 2024-03-01 MED ORDER — LOVASTATIN 10 MG PO TABS: ORAL_TABLET | ORAL | 3 refills | Status: AC

## 2024-03-01 MED ORDER — CARVEDILOL 25 MG PO TABS: 25.0000 mg | ORAL_TABLET | Freq: Every day | ORAL | 1 refills | Status: DC

## 2024-03-01 MED ORDER — FENOFIBRATE 48 MG PO TABS: 48.0000 mg | ORAL_TABLET | Freq: Every day | ORAL | 3 refills | Status: AC

## 2024-03-01 MED ORDER — ENALAPRIL MALEATE 20 MG PO TABS: ORAL_TABLET | ORAL | 3 refills | Status: AC

## 2024-03-01 MED ORDER — ZOLPIDEM TARTRATE 5 MG PO TABS
5.0000 mg | ORAL_TABLET | Freq: Every evening | ORAL | 3 refills | Status: DC | PRN
Start: 2024-03-01 — End: 2024-10-28

## 2024-03-01 MED ORDER — AMLODIPINE BESYLATE 5 MG PO TABS
ORAL_TABLET | ORAL | 3 refills | Status: AC
Start: 2024-03-01 — End: ?

## 2024-03-01 NOTE — Addendum Note (Signed)
 Addended byChristen Butter on: 03/01/2024 09:24 AM   Modules accepted: Orders

## 2024-03-01 NOTE — Progress Notes (Signed)
        Established patient visit  History, exam, impression, and plan:  1. Diabetes mellitus with peripheral vascular disease (HCC) (Primary) Pleasant 68 year old male presenting today with a history of type 2 diabetes.  He is currently taking metformin XR 750 mg daily with breakfast, tolerating well without side effects.  Last POCT hemoglobin A1c at 6.6%.  Recheck today at 6.9%.  Still controlled but recommend working on dietary compliance.  Continue metformin 750 mg daily with breakfast.  Recommend checking sugars with a fasting goal of 90-120. - POCT HgB A1C  2. Ventricular tachycardia (HCC) Recent long-term monitor report showing ventricular tachycardia/SVT that was not sustained but occurred on multiple occasions.  A referral was placed to cardiology however he did not want to drive to [facility].  Redoing the referral today to have him connect with Dr. Jens Som here in the [place] location.  On exam, HRRR, S1/S2 normal.  Lungs CTA, respirations even and unlabored.  No current concerns of chest pain, palpitations, shortness of breath, diaphoresis, nausea, or left arm pain. - Ambulatory referral to Cardiology  3. Rheumatoid arthritis, involving unspecified site, unspecified whether rheumatoid factor present (HCC) History of rheumatoid arthritis affecting multiple joints in the body.  He is not under the care of a rheumatologist and reports that he just deals with the pain.  He has ibuprofen 600 mg every 8 hours as needed as well as gabapentin 600 mg twice daily which he feels is helpful.  Notes that if he does not take the medications, he is in a lot more pain and having a hard time functioning.  Has difficulty sleeping at night due to pain.  Taking Ambien 5 mg nightly as needed which is very helpful however he ran out.  Okay to continue Ambien as prescribed.  Consider increasing nighttime gabapentin to 900 mg if needed.  4. Systolic murmur Systolic murmur noted on exam as previously  documented.  Referring to cardiology.  An echocardiogram was ordered however this was also in [place] and he did not want to drive there.  Order modified to be sent to an external location somewhere in [place] for ease of access. - Ambulatory referral to Cardiology   Procedures performed this visit: None.  Return in about 6 months (around 09/01/2024) for DM/HTN/HLD follow up.  __________________________________ Thayer Ohm, DNP, APRN, FNP-BC Primary Care and Sports Medicine [facility] [employer]

## 2024-03-07 ENCOUNTER — Ambulatory Visit: Payer: Medicare HMO | Admitting: Medical-Surgical

## 2024-03-12 DIAGNOSIS — G4700 Insomnia, unspecified: Secondary | ICD-10-CM | POA: Diagnosis not present

## 2024-03-12 DIAGNOSIS — I10 Essential (primary) hypertension: Secondary | ICD-10-CM | POA: Diagnosis not present

## 2024-03-12 DIAGNOSIS — K219 Gastro-esophageal reflux disease without esophagitis: Secondary | ICD-10-CM | POA: Diagnosis not present

## 2024-03-12 DIAGNOSIS — E785 Hyperlipidemia, unspecified: Secondary | ICD-10-CM | POA: Diagnosis not present

## 2024-03-12 DIAGNOSIS — F411 Generalized anxiety disorder: Secondary | ICD-10-CM | POA: Diagnosis not present

## 2024-03-12 DIAGNOSIS — E663 Overweight: Secondary | ICD-10-CM | POA: Diagnosis not present

## 2024-03-12 DIAGNOSIS — R2681 Unsteadiness on feet: Secondary | ICD-10-CM | POA: Diagnosis not present

## 2024-03-12 DIAGNOSIS — Z008 Encounter for other general examination: Secondary | ICD-10-CM | POA: Diagnosis not present

## 2024-03-12 DIAGNOSIS — Z6826 Body mass index (BMI) 26.0-26.9, adult: Secondary | ICD-10-CM | POA: Diagnosis not present

## 2024-03-12 DIAGNOSIS — F17210 Nicotine dependence, cigarettes, uncomplicated: Secondary | ICD-10-CM | POA: Diagnosis not present

## 2024-03-21 ENCOUNTER — Encounter: Payer: Self-pay | Admitting: Medical-Surgical

## 2024-03-23 LAB — MICROALBUMIN / CREATININE URINE RATIO: Microalb Creat Ratio: 6

## 2024-03-23 LAB — PROTEIN / CREATININE RATIO, URINE: Creatinine, Urine: 6

## 2024-04-02 ENCOUNTER — Encounter: Payer: Self-pay | Admitting: Medical-Surgical

## 2024-05-14 ENCOUNTER — Telehealth: Payer: Self-pay

## 2024-05-14 NOTE — Telephone Encounter (Signed)
 Patient wife will drop off paperwork for completion - stating that nothing has changed.   Original message was sent in patient's wife's chart- copied and pasted to correct patients chart.   Cherre Cornish, NP to Doretha Ganja, CMA (Selected Message)    05/14/24 12:23 PM It would be good to have at least a virtual visit to discuss the paperwork and any potential changes that may need to be made.  If nothing is changing and she simply wants to refresh, we do not have to do a visit but she will have the $29 form fee. Thanks, Jerrilyn Moras, Daria Eddy, CMA to Cherre Cornish, NP    05/14/24  9:30 AM Will she need an appointment? Rosa Sifford-Moncrieffe to [facility] (supporting Cherre Cornish, NP)     05/14/24  9:26 AM I can bring them in tomorrow if that's ok Doretha Ganja, CMA to [place] Sifford[place]     05/14/24  8:01 AM Rosa,   Do you have a copy of the FMLA forms? If so, please send them.    Take care, Shelvy Dickens  Last read by Heather Litter at 12:01PM on 05/14/2024. Rosa Sifford-Truett to [facility] (supporting Cherre Cornish, NP)     05/13/24 10:42 PM Joy  can you fill out my FMLA paperwork it run out June  the 11?

## 2024-05-20 ENCOUNTER — Other Ambulatory Visit: Payer: Self-pay | Admitting: Medical-Surgical

## 2024-05-20 DIAGNOSIS — M961 Postlaminectomy syndrome, not elsewhere classified: Secondary | ICD-10-CM

## 2024-05-21 DIAGNOSIS — R7889 Finding of other specified substances, not normally found in blood: Secondary | ICD-10-CM | POA: Diagnosis not present

## 2024-05-21 NOTE — Telephone Encounter (Signed)
 Patient requesting rx rf of gabapentin  300mg   Patient states he does occasionally take 3 tablets at nighttime - states he was told he could do this by Jackqueline Mason  Last written 02/02/2024 Last OV 03/01/2024 Upcoming appt 08/08/2024- AWV 08/30/2024 Joy Jessup,NP

## 2024-05-22 LAB — LAB REPORT - SCANNED: EGFR: 75

## 2024-06-13 NOTE — Progress Notes (Unsigned)
 Referring-Richard Jessup, NP Reason for referral-murmur and nonsustained ventricular tachycardia  HPI: 68 year old male for evaluation of murmur and nonsustained ventricular tachycardia at request of Zada Palin, NP.  Carotid Dopplers October 2017 showed no plaque.  Renal Dopplers October 2017 showed no renal artery stenosis.  Monitor January 2025 showed sinus rhythm with 5 beats of nonsustained ventricular tachycardia, short (longest 14 beats) runs of SVT and rare PACs and PVCs.  Patient apparently has had functional studies at [facility] previously for chest pain but I do not have results available.  Current Outpatient Medications  Medication Sig Dispense Refill   amLODipine  (NORVASC ) 5 MG tablet TAKE 1 TABLET(5 MG) BY MOUTH DAILY 90 tablet 3   ASPIRIN  LOW DOSE 81 MG tablet Take 1 tablet (81 mg total) by mouth daily. 90 tablet 0   carvedilol  (COREG ) 25 MG tablet Take 1 tablet (25 mg total) by mouth daily. 90 tablet 1   clotrimazole -betamethasone  (LOTRISONE ) cream Apply 1 Application topically daily. 30 g 0   cyclobenzaprine  (FLEXERIL ) 10 MG tablet Take 1 tablet (10 mg total) by mouth 3 (three) times daily as needed for muscle spasms. 30 tablet 0   enalapril  (VASOTEC ) 20 MG tablet TAKE 1 TABLET(20 MG) BY MOUTH TWICE DAILY 180 tablet 3   escitalopram  (LEXAPRO ) 10 MG tablet Take 1 tablet (10 mg total) by mouth daily. 90 tablet 3   famotidine  (PEPCID ) 20 MG tablet Take 1 tablet (20 mg total) by mouth 2 (two) times daily. 60 tablet 5   fenofibrate  (TRICOR ) 48 MG tablet Take 1 tablet (48 mg total) by mouth daily. 90 tablet 3   gabapentin  (NEURONTIN ) 300 MG capsule Take 2 capsules (600 mg total) by mouth every morning AND 2-3 capsules (600-900 mg total) at bedtime. 150 capsule 3   ibuprofen  (ADVIL ) 600 MG tablet Take 1 tablet (600 mg total) by mouth every 8 (eight) hours as needed. 60 tablet 3   lovastatin  (MEVACOR ) 10 MG tablet TAKE 1 TABLET(10 MG) BY MOUTH AT BEDTIME 90 tablet 3   metFORMIN   (GLUCOPHAGE -XR) 750 MG 24 hr tablet Take 1 tablet (750 mg total) by mouth daily with breakfast. 90 tablet 3   pantoprazole  (PROTONIX ) 20 MG tablet Take 1 tablet (20 mg total) by mouth daily. 90 tablet 1   zolpidem  (AMBIEN ) 5 MG tablet Take 1 tablet (5 mg total) by mouth at bedtime as needed for sleep. 90 tablet 3   No current facility-administered medications for this visit.    Allergies  Allergen Reactions   Semaglutide  Other (See Comments)    Rybelsus  caused GI upset and discomfort   Codeine     Headache     Past Medical History:  Diagnosis Date   Allergy 1985   Anemia 2015   Anxiety    Arthritis 2000   Atrial fibrillation (HCC)    Depression    Diabetes mellitus without complication (HCC)    GERD (gastroesophageal reflux disease) 2004   Heart murmur 1996   Hyperlipidemia    Hypertension    Osteoporosis 1996    Past Surgical History:  Procedure Laterality Date   CHOLECYSTECTOMY     FOOT SURGERY     FRACTURE SURGERY  1996   HERNIA REPAIR     JOINT REPLACEMENT     Had 5 hip surgery   KNEE SURGERY     SPINE SURGERY  2016    Social History   Socioeconomic History   Marital status: Married    Spouse name: Raoul Redman  Mcaffee   Number of children: 7   Years of education: 14   Highest education level: Associate degree: occupational, Scientist, product/process development, or vocational program  Occupational History   Occupation: Disabled  Tobacco Use   Smoking status: Every Day    Current packs/day: 0.50    Average packs/day: 0.5 packs/day for 53.0 years (26.5 ttl pk-yrs)    Types: Cigarettes   Smokeless tobacco: Never  Vaping Use   Vaping status: Former  Substance and Sexual Activity   Alcohol use: Not Currently   Drug use: Yes    Types: Marijuana    Comment: 1-2 uses per week   Sexual activity: Yes    Partners: Female    Birth control/protection: None  Other Topics Concern   Not on file  Social History Narrative   Lives with his spouse. He has 7 children and two step children.  34 grandchildren. 6 great grandchildren. He enjoys lifting weights.   Social Drivers of Health   Financial Resource Strain: Medium Risk (02/26/2024)   Overall Financial Resource Strain (CARDIA)    Difficulty of Paying Living Expenses: Somewhat hard  Food Insecurity: Food Insecurity Present (02/26/2024)   Hunger Vital Sign    Worried About Running Out of Food in the Last Year: Sometimes true    Ran Out of Food in the Last Year: Sometimes true  Transportation Needs: No Transportation Needs (02/26/2024)   PRAPARE - Administrator, Civil Service (Medical): No    Lack of Transportation (Non-Medical): No  Physical Activity: Insufficiently Active (02/26/2024)   Exercise Vital Sign    Days of Exercise per Week: 3 days    Minutes of Exercise per Session: 20 min  Stress: Stress Concern Present (02/26/2024)   [employer] of Occupational Health - Occupational Stress Questionnaire    Feeling of Stress : To some extent  Social Connections: Moderately Isolated (02/26/2024)   Social Connection and Isolation Panel    Frequency of Communication with Friends and Family: Three times a week    Frequency of Social Gatherings with Friends and Family: Patient declined    Attends Religious Services: Never    Database administrator or Organizations: No    Attends Banker Meetings: Never    Marital Status: Married  Catering manager Violence: Not At Risk (12/13/2023)   Received from [facility]   HITS    Over the last 12 months how often did your partner physically hurt you?: Never    Over the last 12 months how often did your partner insult you or talk down to you?: Never    Over the last 12 months how often did your partner threaten you with physical harm?: Never    Over the last 12 months how often did your partner scream or curse at you?: Never    Family History  Problem Relation Age of Onset   Diabetes Mother    Hypertension Mother    Breast cancer Mother    Cancer Mother     Diabetes Brother    Hypertension Brother    Hypertension Maternal Grandfather    Diabetes Maternal Grandfather    Arthritis Father     ROS: no fevers or chills, productive cough, hemoptysis, dysphasia, odynophagia, melena, hematochezia, dysuria, hematuria, rash, seizure activity, orthopnea, PND, pedal edema, claudication. Remaining systems are negative.  Physical Exam:   There were no vitals taken for this visit.  General:  Well developed/well nourished in NAD Skin warm/dry Patient not depressed No peripheral clubbing  Back-normal HEENT-normal/normal eyelids Neck supple/normal carotid upstroke bilaterally; no bruits; no JVD; no thyromegaly chest - CTA/ normal expansion CV - RRR/normal S1 and S2; no murmurs, rubs or gallops;  PMI nondisplaced Abdomen -NT/ND, no HSM, no mass, + bowel sounds, no bruit 2+ femoral pulses, no bruits Ext-no edema, chords, 2+ DP Neuro-grossly nonfocal  ECG - personally reviewed  A/P  1 murmur-  2 nonsustained ventricular tachycardia-  3 hypertension-  4 hyperlipidemia-  5 chest pain-  Redell Shallow, MD

## 2024-06-19 ENCOUNTER — Ambulatory Visit (INDEPENDENT_AMBULATORY_CARE_PROVIDER_SITE_OTHER): Admitting: Cardiology

## 2024-06-19 ENCOUNTER — Encounter: Payer: Self-pay | Admitting: Cardiology

## 2024-06-19 VITALS — BP 152/86 | HR 74 | Ht 72.0 in | Wt 192.8 lb

## 2024-06-19 DIAGNOSIS — R002 Palpitations: Secondary | ICD-10-CM | POA: Diagnosis not present

## 2024-06-19 DIAGNOSIS — R0989 Other specified symptoms and signs involving the circulatory and respiratory systems: Secondary | ICD-10-CM | POA: Diagnosis not present

## 2024-06-19 DIAGNOSIS — Z136 Encounter for screening for cardiovascular disorders: Secondary | ICD-10-CM

## 2024-06-19 MED ORDER — CARVEDILOL 25 MG PO TABS: 25.0000 mg | ORAL_TABLET | Freq: Two times a day (BID) | ORAL | 3 refills | Status: DC

## 2024-06-19 NOTE — Patient Instructions (Signed)
 Medication Instructions:   INCREASE CARVEDILOL  25 MG TWICE DAILY  *If you need a refill on your cardiac medications before your next appointment, please call your pharmacy*  Testing/Procedures:  Your physician has requested that you have an echocardiogram. Echocardiography is a painless test that uses sound waves to create images of your heart. It provides your doctor with information about the size and shape of your heart and how well your heart's chambers and valves are working. This procedure takes approximately one hour. There are no restrictions for this procedure. Please do NOT wear cologne, perfume, aftershave, or lotions (deodorant is allowed). Please arrive 15 minutes prior to your appointment time.  Please note: We ask at that you not bring children with you during ultrasound (echo/ vascular) testing. Due to room size and safety concerns, children are not allowed in the ultrasound rooms during exams. Our front office staff cannot provide observation of children in our lobby area while testing is being conducted. An adult accompanying a patient to their appointment will only be allowed in the ultrasound room at the discretion of the ultrasound technician under special circumstances. We apologize for any inconvenience. [address]   Your physician has requested that you have an abdominal aorta duplex. During this test, an ultrasound is used to evaluate the aorta. Allow 30 minutes for this exam. Do not eat after midnight the day before and avoid carbonated beverages.  Please note: We ask at that you not bring children with you during ultrasound (echo/ vascular) testing. Due to room size and safety concerns, children are not allowed in the ultrasound rooms during exams. Our front office staff cannot provide observation of children in our lobby area while testing is being conducted. An adult accompanying a patient to their appointment will only be allowed in the ultrasound room at the  discretion of the ultrasound technician under special circumstances. We apologize for any inconvenience. [address]  Follow-Up: At [facility], you and your health needs are our priority.  As part of our continuing mission to provide you with exceptional heart care, our providers are all part of one team.  This team includes your primary Cardiologist (physician) and Advanced Practice Providers or APPs (Physician Assistants and Nurse Practitioners) who all work together to provide you with the care you need, when you need it.  Your next appointment:   6 month(s)  Provider:   Redell Shallow, MD

## 2024-07-16 DIAGNOSIS — S92422B Displaced fracture of distal phalanx of left great toe, initial encounter for open fracture: Secondary | ICD-10-CM | POA: Diagnosis not present

## 2024-07-16 DIAGNOSIS — Z23 Encounter for immunization: Secondary | ICD-10-CM | POA: Diagnosis not present

## 2024-07-16 DIAGNOSIS — I129 Hypertensive chronic kidney disease with stage 1 through stage 4 chronic kidney disease, or unspecified chronic kidney disease: Secondary | ICD-10-CM | POA: Diagnosis not present

## 2024-07-16 DIAGNOSIS — Z9889 Other specified postprocedural states: Secondary | ICD-10-CM | POA: Diagnosis not present

## 2024-07-16 DIAGNOSIS — Z79899 Other long term (current) drug therapy: Secondary | ICD-10-CM | POA: Diagnosis not present

## 2024-07-16 DIAGNOSIS — E1140 Type 2 diabetes mellitus with diabetic neuropathy, unspecified: Secondary | ICD-10-CM | POA: Diagnosis not present

## 2024-07-16 DIAGNOSIS — Z7984 Long term (current) use of oral hypoglycemic drugs: Secondary | ICD-10-CM | POA: Diagnosis not present

## 2024-07-16 DIAGNOSIS — Z7982 Long term (current) use of aspirin: Secondary | ICD-10-CM | POA: Diagnosis not present

## 2024-07-16 DIAGNOSIS — E782 Mixed hyperlipidemia: Secondary | ICD-10-CM | POA: Diagnosis not present

## 2024-07-16 DIAGNOSIS — K219 Gastro-esophageal reflux disease without esophagitis: Secondary | ICD-10-CM | POA: Diagnosis not present

## 2024-07-16 DIAGNOSIS — Z96642 Presence of left artificial hip joint: Secondary | ICD-10-CM | POA: Diagnosis not present

## 2024-07-16 DIAGNOSIS — N189 Chronic kidney disease, unspecified: Secondary | ICD-10-CM | POA: Diagnosis not present

## 2024-07-16 DIAGNOSIS — F418 Other specified anxiety disorders: Secondary | ICD-10-CM | POA: Diagnosis not present

## 2024-07-16 DIAGNOSIS — S97112A Crushing injury of left great toe, initial encounter: Secondary | ICD-10-CM | POA: Diagnosis not present

## 2024-07-16 DIAGNOSIS — I10 Essential (primary) hypertension: Secondary | ICD-10-CM | POA: Diagnosis not present

## 2024-07-16 DIAGNOSIS — N179 Acute kidney failure, unspecified: Secondary | ICD-10-CM | POA: Diagnosis not present

## 2024-07-16 DIAGNOSIS — E1122 Type 2 diabetes mellitus with diabetic chronic kidney disease: Secondary | ICD-10-CM | POA: Diagnosis not present

## 2024-07-16 DIAGNOSIS — Z7409 Other reduced mobility: Secondary | ICD-10-CM | POA: Diagnosis not present

## 2024-07-16 DIAGNOSIS — D649 Anemia, unspecified: Secondary | ICD-10-CM | POA: Diagnosis not present

## 2024-07-16 DIAGNOSIS — E119 Type 2 diabetes mellitus without complications: Secondary | ICD-10-CM | POA: Diagnosis not present

## 2024-07-16 DIAGNOSIS — S96112A Strain of muscle and tendon of long extensor muscle of toe at ankle and foot level, left foot, initial encounter: Secondary | ICD-10-CM | POA: Diagnosis not present

## 2024-07-16 DIAGNOSIS — Z87891 Personal history of nicotine dependence: Secondary | ICD-10-CM | POA: Diagnosis not present

## 2024-07-16 DIAGNOSIS — S92402B Displaced unspecified fracture of left great toe, initial encounter for open fracture: Secondary | ICD-10-CM | POA: Diagnosis not present

## 2024-07-17 DIAGNOSIS — S92415B Nondisplaced fracture of proximal phalanx of left great toe, initial encounter for open fracture: Secondary | ICD-10-CM | POA: Diagnosis not present

## 2024-07-17 LAB — HEMOGLOBIN A1C

## 2024-07-18 DIAGNOSIS — M79605 Pain in left leg: Secondary | ICD-10-CM | POA: Diagnosis not present

## 2024-07-18 DIAGNOSIS — I10 Essential (primary) hypertension: Secondary | ICD-10-CM | POA: Diagnosis not present

## 2024-07-18 DIAGNOSIS — E782 Mixed hyperlipidemia: Secondary | ICD-10-CM | POA: Diagnosis not present

## 2024-07-18 DIAGNOSIS — S91212A Laceration without foreign body of left great toe with damage to nail, initial encounter: Secondary | ICD-10-CM | POA: Diagnosis not present

## 2024-07-18 DIAGNOSIS — E119 Type 2 diabetes mellitus without complications: Secondary | ICD-10-CM | POA: Diagnosis not present

## 2024-07-19 ENCOUNTER — Telehealth: Payer: Self-pay

## 2024-07-19 NOTE — Transitions of Care (Post Inpatient/ED Visit) (Signed)
   07/19/2024  Name: Richard Fuller MRN: [id number] DOB: 05-11-1956  Today's TOC FU Call Status: Today's TOC FU Call Status:: Unsuccessful Call (1st Attempt) Unsuccessful Call (1st Attempt) Date: 07/19/24  Attempted to reach the patient regarding the most recent Inpatient/ED visit.  Follow Up Plan: Additional outreach attempts will be made to reach the patient to complete the Transitions of Care (Post Inpatient/ED visit) call.   Shona Prow RN, CCM [place]  VBCI-Population Health RN Care Manager [phone]

## 2024-07-19 NOTE — Transitions of Care (Post Inpatient/ED Visit) (Signed)
   07/19/2024  Name: Richard Fuller MRN: [id number] DOB: Feb 11, 1956  Today's TOC FU Call Status: Today's TOC FU Call Status:: Unsuccessful Call (2nd Attempt) Unsuccessful Call (2nd Attempt) Date: 07/19/24  Attempted to reach the patient regarding the most recent Inpatient/ED visit.  Follow Up Plan: Additional outreach attempts will be made to reach the patient to complete the Transitions of Care (Post Inpatient/ED visit) call.   Shona Prow RN, CCM [place]  VBCI-Population Health RN Care Manager [phone]

## 2024-07-20 ENCOUNTER — Encounter: Payer: Self-pay | Admitting: Medical-Surgical

## 2024-07-22 ENCOUNTER — Telehealth: Payer: Self-pay

## 2024-07-22 NOTE — Transitions of Care (Post Inpatient/ED Visit) (Signed)
   07/22/2024  Name: Richard Fuller MRN: [id number] DOB: March 30, 1956  Today's TOC FU Call Status:    Attempted to reach the patient regarding the most recent Inpatient/ED visit.  Follow Up Plan: No further outreach attempts will be made at this time. We have been unable to contact the patient.  Shona Prow RN, CCM [place]  VBCI-Population Health RN Care Manager [phone]

## 2024-07-31 DIAGNOSIS — M79672 Pain in left foot: Secondary | ICD-10-CM | POA: Diagnosis not present

## 2024-08-06 ENCOUNTER — Ambulatory Visit (HOSPITAL_BASED_OUTPATIENT_CLINIC_OR_DEPARTMENT_OTHER)
Admission: RE | Admit: 2024-08-06 | Discharge: 2024-08-06 | Disposition: A | Discharge status: Home / Self Care | Source: Ambulatory Visit | Attending: Cardiology | Admitting: Cardiology

## 2024-08-06 ENCOUNTER — Ambulatory Visit (HOSPITAL_COMMUNITY)
Admission: RE | Admit: 2024-08-06 | Discharge: 2024-08-06 | Disposition: A | Discharge status: Home / Self Care | Source: Ambulatory Visit | Attending: Cardiology | Admitting: Cardiology

## 2024-08-06 ENCOUNTER — Ambulatory Visit: Payer: Self-pay | Admitting: Cardiology

## 2024-08-06 DIAGNOSIS — R0989 Other specified symptoms and signs involving the circulatory and respiratory systems: Secondary | ICD-10-CM

## 2024-08-06 DIAGNOSIS — E785 Hyperlipidemia, unspecified: Secondary | ICD-10-CM | POA: Insufficient documentation

## 2024-08-06 DIAGNOSIS — I080 Rheumatic disorders of both mitral and aortic valves: Secondary | ICD-10-CM | POA: Diagnosis not present

## 2024-08-06 DIAGNOSIS — R600 Localized edema: Secondary | ICD-10-CM

## 2024-08-06 DIAGNOSIS — Z136 Encounter for screening for cardiovascular disorders: Secondary | ICD-10-CM | POA: Diagnosis not present

## 2024-08-06 DIAGNOSIS — R002 Palpitations: Secondary | ICD-10-CM | POA: Insufficient documentation

## 2024-08-06 DIAGNOSIS — I10 Essential (primary) hypertension: Secondary | ICD-10-CM | POA: Insufficient documentation

## 2024-08-06 DIAGNOSIS — R011 Cardiac murmur, unspecified: Secondary | ICD-10-CM | POA: Insufficient documentation

## 2024-08-06 DIAGNOSIS — E119 Type 2 diabetes mellitus without complications: Secondary | ICD-10-CM | POA: Insufficient documentation

## 2024-08-06 DIAGNOSIS — R609 Edema, unspecified: Secondary | ICD-10-CM | POA: Diagnosis not present

## 2024-08-06 DIAGNOSIS — F17210 Nicotine dependence, cigarettes, uncomplicated: Secondary | ICD-10-CM

## 2024-08-06 DIAGNOSIS — I371 Nonrheumatic pulmonary valve insufficiency: Secondary | ICD-10-CM | POA: Insufficient documentation

## 2024-08-06 DIAGNOSIS — R000 Tachycardia, unspecified: Secondary | ICD-10-CM | POA: Insufficient documentation

## 2024-08-06 LAB — ECHOCARDIOGRAM COMPLETE
Area-P 1/2: 3.96 cm2
P 1/2 time: 608 ms
S' Lateral: 3 cm

## 2024-08-08 ENCOUNTER — Ambulatory Visit (INDEPENDENT_AMBULATORY_CARE_PROVIDER_SITE_OTHER)

## 2024-08-08 VITALS — Ht 71.5 in | Wt 192.0 lb

## 2024-08-08 DIAGNOSIS — Z0000 Encounter for general adult medical examination without abnormal findings: Secondary | ICD-10-CM | POA: Diagnosis not present

## 2024-08-08 DIAGNOSIS — Z5941 Food insecurity: Secondary | ICD-10-CM

## 2024-08-08 DIAGNOSIS — E1151 Type 2 diabetes mellitus with diabetic peripheral angiopathy without gangrene: Secondary | ICD-10-CM

## 2024-08-08 MED ORDER — ONETOUCH ULTRA TEST VI STRP: ORAL_STRIP | 12 refills | Status: AC

## 2024-08-08 MED ORDER — FAMOTIDINE 20 MG PO TABS: 20.0000 mg | ORAL_TABLET | Freq: Two times a day (BID) | ORAL | 5 refills | Status: DC

## 2024-08-08 MED ORDER — ONETOUCH ULTRA 2 W/DEVICE KIT
PACK | 99 refills | Status: AC
Start: 2024-08-08 — End: ?

## 2024-08-08 MED ORDER — LANCETS 30G MISC: 99 refills | Status: AC

## 2024-08-08 NOTE — Progress Notes (Signed)
 Subjective:   Richard Fuller is a 68 y.o. male who presents for Medicare Annual/Subsequent preventive examination.  Visit Complete: Virtual I connected with  Jerel Cliche on 08/08/24 by a audio enabled telemedicine application and verified that I am speaking with the correct person using two identifiers.  Patient Location: Home  Provider Location: Office/Clinic  I discussed the limitations of evaluation and management by telemedicine. The patient expressed understanding and agreed to proceed.  Vital Signs: Because this visit was a virtual/telehealth visit, some criteria may be missing or patient reported. Any vitals not documented were not able to be obtained and vitals that have been documented are patient reported.  Patient Medicare AWV questionnaire was completed by the patient on 08/07/2024; I have confirmed that all information answered by patient is correct and no changes since this date.  Cardiac Risk Factors include: advanced age (>66men, >101 women);male gender;hypertension;diabetes mellitus;dyslipidemia;obesity (BMI >30kg/m2);smoking/ tobacco exposure     Objective:    Today's Vitals   08/08/24 1010 08/08/24 1011  Weight: 192 lb (87.1 kg)   Height: 5' 11.5 (1.816 m)   PainSc:  6    Body mass index is 26.41 kg/m.     08/08/2024   10:23 AM 08/01/2023   10:11 AM 07/27/2022   10:12 AM  Advanced Directives  Does Patient Have a Medical Advance Directive? No No No  Would patient like information on creating a medical advance directive? No - Patient declined No - Patient declined No - Patient declined    Current Medications (verified) Outpatient Encounter Medications as of 08/08/2024  Medication Sig   amLODipine  (NORVASC ) 5 MG tablet TAKE 1 TABLET(5 MG) BY MOUTH DAILY   ASPIRIN  LOW DOSE 81 MG tablet Take 1 tablet (81 mg total) by mouth daily.   carvedilol  (COREG ) 25 MG tablet Take 1 tablet (25 mg total) by mouth 2 (two) times daily with a meal.   clotrimazole -betamethasone   (LOTRISONE ) cream Apply 1 Application topically daily.   enalapril  (VASOTEC ) 20 MG tablet TAKE 1 TABLET(20 MG) BY MOUTH TWICE DAILY   escitalopram  (LEXAPRO ) 10 MG tablet Take 1 tablet (10 mg total) by mouth daily.   famotidine  (PEPCID ) 20 MG tablet Take 1 tablet (20 mg total) by mouth 2 (two) times daily.   fenofibrate  (TRICOR ) 48 MG tablet Take 1 tablet (48 mg total) by mouth daily.   gabapentin  (NEURONTIN ) 300 MG capsule Take 2 capsules (600 mg total) by mouth every morning AND 2-3 capsules (600-900 mg total) at bedtime.   ibuprofen  (ADVIL ) 600 MG tablet Take 1 tablet (600 mg total) by mouth every 8 (eight) hours as needed.   lovastatin  (MEVACOR ) 10 MG tablet TAKE 1 TABLET(10 MG) BY MOUTH AT BEDTIME   metFORMIN  (GLUCOPHAGE -XR) 750 MG 24 hr tablet Take 1 tablet (750 mg total) by mouth daily with breakfast.   zolpidem  (AMBIEN ) 5 MG tablet Take 1 tablet (5 mg total) by mouth at bedtime as needed for sleep.   cyclobenzaprine  (FLEXERIL ) 10 MG tablet Take 1 tablet (10 mg total) by mouth 3 (three) times daily as needed for muscle spasms. (Patient not taking: Reported on 08/08/2024)   pantoprazole  (PROTONIX ) 20 MG tablet Take 1 tablet (20 mg total) by mouth daily. (Patient not taking: Reported on 08/08/2024)   No facility-administered encounter medications on file as of 08/08/2024.    Allergies (verified) Semaglutide  and Codeine   History: Past Medical History:  Diagnosis Date   Allergy 1985   Anemia 2015   Anxiety    Arthritis 2000  Depression    Diabetes mellitus without complication (HCC)    GERD (gastroesophageal reflux disease) 2004   Heart murmur 1996   Hyperlipidemia    Hypertension    Osteoporosis 1996   Past Surgical History:  Procedure Laterality Date   CHOLECYSTECTOMY     FOOT SURGERY     FRACTURE SURGERY  1996   HERNIA REPAIR     JOINT REPLACEMENT     Had 5 hip surgery   KNEE SURGERY     SPINE SURGERY  2016   Family History  Problem Relation Age of Onset   Diabetes  Mother    Hypertension Mother    Breast cancer Mother    Cancer Mother    Arthritis Father    Diabetes Brother    Hypertension Brother    Hypertension Maternal Grandfather    Diabetes Maternal Grandfather    Social History   Socioeconomic History   Marital status: Married    Spouse name: Abriel Geesey   Number of children: 7   Years of education: 14   Highest education level: Associate degree: occupational, Scientist, product/process development, or vocational program  Occupational History   Occupation: Disabled  Tobacco Use   Smoking status: Every Day    Current packs/day: 0.50    Average packs/day: 0.5 packs/day for 53.0 years (26.5 ttl pk-yrs)    Types: Cigarettes    Start date: 07/29/1971   Smokeless tobacco: Never  Vaping Use   Vaping status: Former  Substance and Sexual Activity   Alcohol use: Never   Drug use: Yes    Types: Marijuana    Comment: 1-2 uses per week   Sexual activity: Yes    Partners: Female    Birth control/protection: None  Other Topics Concern   Not on file  Social History Narrative   Lives with his spouse. He has 7 children and two step children. 34 grandchildren. 6 great grandchildren. He enjoys lifting weights.   Social Drivers of Health   Financial Resource Strain: Medium Risk (08/08/2024)   Overall Financial Resource Strain (CARDIA)    Difficulty of Paying Living Expenses: Somewhat hard  Food Insecurity: Food Insecurity Present (08/08/2024)   Hunger Vital Sign    Worried About Running Out of Food in the Last Year: Sometimes true    Ran Out of Food in the Last Year: Sometimes true  Transportation Needs: No Transportation Needs (08/08/2024)   PRAPARE - Administrator, Civil Service (Medical): No    Lack of Transportation (Non-Medical): No  Physical Activity: Insufficiently Active (08/08/2024)   Exercise Vital Sign    Days of Exercise per Week: 4 days    Minutes of Exercise per Session: 30 min  Stress: No Stress Concern Present (08/08/2024)   [employer] of Occupational Health - Occupational Stress Questionnaire    Feeling of Stress: Not at all  Social Connections: Moderately Isolated (08/08/2024)   Social Connection and Isolation Panel    Frequency of Communication with Friends and Family: More than three times a week    Frequency of Social Gatherings with Friends and Family: Once a week    Attends Religious Services: Never    Database administrator or Organizations: No    Attends Engineer, structural: Never    Marital Status: Married    Tobacco Counseling Ready to quit: Not Answered Counseling given: Not Answered   Clinical Intake:  Pre-visit preparation completed: Yes  Pain : 0-10 Pain Score: 6  Pain Type: Acute  pain Pain Location: Toe (Comment which one)     BMI - recorded: 26.41 Nutritional Status: BMI 25 -29 Overweight Nutritional Risks: None Diabetes: Yes CBG done?: No Did pt. bring in CBG monitor from home?: No  How often do you need to have someone help you when you read instructions, pamphlets, or other written materials from your doctor or pharmacy?: 1 - Never What is the last grade level you completed in school?: 14  Interpreter Needed?: No      Activities of Daily Living    08/08/2024   10:13 AM 08/04/2024   12:21 PM  In your present state of health, do you have any difficulty performing the following activities:  Hearing? 0 0  Vision? 0 0  Difficulty concentrating or making decisions? 0 0  Walking or climbing stairs? 1 1  Dressing or bathing? 0   Doing errands, shopping? 0 0  Preparing Food and eating ? N N  Using the Toilet? N N  In the past six months, have you accidently leaked urine? Y Y  Do you have problems with loss of bowel control? N N  Managing your Medications? N N  Managing your Finances? N N  Housekeeping or managing your Housekeeping? N N    Patient Care Team: Willo Mini, NP as PCP - General (Nurse Practitioner) Pietro Redell RAMAN, MD as Consulting Physician  (Cardiology)  Indicate any recent Medical Services you may have received from other than [facility] providers in the past year (date may be approximate).     Assessment:   This is a routine wellness examination for Richard Fuller.  Hearing/Vision screen No results found.   Goals Addressed             This Visit's Progress    Patient Stated       Patient states he would like to stay in good shape.        Depression Screen    08/08/2024   10:21 AM 12/11/2023    1:57 PM 08/01/2023   10:19 AM 08/01/2023   10:13 AM 03/08/2023    9:36 AM 11/21/2022    9:32 AM 11/01/2022    1:59 PM  PHQ 2/9 Scores  PHQ - 2 Score 0 0 0 0 0 0 0  PHQ- 9 Score  1 0        Fall Risk    08/08/2024   10:23 AM 08/04/2024   12:21 PM 12/11/2023    1:57 PM 08/01/2023   10:11 AM 07/28/2023   12:10 PM  Fall Risk   Falls in the past year? 1 1 0 0 0  Number falls in past yr: 0 0 0 0 0  Injury with Fall? 0 0 0 0 0  Risk for fall due to : No Fall Risks  No Fall Risks Impaired mobility   Follow up Falls evaluation completed   Education provided;Falls evaluation completed;Falls prevention discussed     MEDICARE RISK AT HOME: Medicare Risk at Home Any stairs in or around the home?: No If so, are there any without handrails?: No Home free of loose throw rugs in walkways, pet beds, electrical cords, etc?: Yes Adequate lighting in your home to reduce risk of falls?: Yes Life alert?: No Use of a cane, walker or w/c?: Yes Grab bars in the bathroom?: Yes Shower chair or bench in shower?: No Elevated toilet seat or a handicapped toilet?: Yes  TIMED UP AND GO:  Was the test performed?  No    Cognitive  Function:        08/08/2024   10:23 AM 08/01/2023   10:19 AM 07/27/2022   10:18 AM  6CIT Screen  What Year? 0 points 0 points 0 points  What month? 0 points 0 points 0 points  What time? 0 points 0 points 0 points  Count back from 20 0 points 0 points 0 points  Months in reverse 0 points 0 points 0 points  Repeat phrase  2 points 2 points 2 points  Total Score 2 points 2 points 2 points    Immunizations Immunization History  Administered Date(s) Administered   Fluad Trivalent(High Dose 65+) 09/08/2023   Influenza Split 10/18/2013, 09/07/2015, 09/15/2018   Influenza, Seasonal, Injecte, Preservative Fre 11/13/2014   Influenza,inj,Quad PF,6+ Mos 11/05/2020, 08/16/2022   Influenza,inj,Quad PF,6-35 Mos 10/18/2013, 09/07/2015   Influenza-Unspecified 11/13/2014, 10/19/2021   PFIZER(Purple Top)SARS-COV-2 Vaccination 04/27/2020, 05/18/2020   PNEUMOCOCCAL CONJUGATE-20 07/30/2022   Pneumococcal Polysaccharide-23 04/21/2014   Tdap 11/03/2015, 07/16/2024   Zoster Recombinant(Shingrix) 07/30/2022    TDAP status: Up to date  Flu Vaccine status: Up to date  Pneumococcal vaccine status: Up to date  Covid-19 vaccine status: Declined, Education has been provided regarding the importance of this vaccine but patient still declined. Advised may receive this vaccine at local pharmacy or Health Dept.or vaccine clinic. Aware to provide a copy of the vaccination record if obtained from local pharmacy or Health Dept. Verbalized acceptance and understanding.  Qualifies for Shingles Vaccine? Yes   Zostavax completed No   Shingrix Completed?: No.    Education has been provided regarding the importance of this vaccine. Patient has been advised to call insurance company to determine out of pocket expense if they have not yet received this vaccine. Advised may also receive vaccine at local pharmacy or Health Dept. Verbalized acceptance and understanding.  Screening Tests Health Maintenance  Topic Date Due   OPHTHALMOLOGY EXAM  Never done   COVID-19 Vaccine (3 - Pfizer risk series) 06/15/2020   Zoster Vaccines- Shingrix (2 of 2) 09/24/2022   Colonoscopy  05/04/2023   FOOT EXAM  08/17/2023   Lung Cancer Screening  03/07/2024   INFLUENZA VACCINE  07/26/2024   HEMOGLOBIN A1C  09/01/2024   Diabetic kidney evaluation - Urine ACR   03/23/2025   Diabetic kidney evaluation - eGFR measurement  05/22/2025   Medicare Annual Wellness (AWV)  08/08/2025   DTaP/Tdap/Td (3 - Td or Tdap) 07/16/2034   Pneumococcal Vaccine: 50+ Years  Completed   Hepatitis C Screening  Completed   HPV VACCINES  Aged Out   Meningococcal B Vaccine  Aged Out    Health Maintenance  Health Maintenance Due  Topic Date Due   OPHTHALMOLOGY EXAM  Never done   COVID-19 Vaccine (3 - Pfizer risk series) 06/15/2020   Zoster Vaccines- Shingrix (2 of 2) 09/24/2022   Colonoscopy  05/04/2023   FOOT EXAM  08/17/2023   Lung Cancer Screening  03/07/2024   INFLUENZA VACCINE  07/26/2024    Colorectal cancer screening - he is going to call to schedule.   Lung Cancer Screening: (Low Dose CT Chest recommended if Age 24-80 years, 20 pack-year currently smoking OR have quit w/in 15years.) does qualify.   Lung Cancer Screening Referral: patient declined.   Additional Screening:  Hepatitis C Screening: does qualify; Completed 11/02/2021  Vision Screening: Recommended annual ophthalmology exams for early detection of glaucoma and other disorders of the eye. Is the patient up to date with their annual eye exam?  Yes  Who  is the provider or what is the name of the office in which the patient attends annual eye exams? [email or web address] If pt is not established with a provider, would they like to be referred to a provider to establish care? N/a.   Dental Screening: Recommended annual dental exams for proper oral hygiene  Diabetic Foot Exam: Diabetic Foot Exam: Completed 08/16/2022  Community Resource Referral / Chronic Care Management: CRR required this visit?  Yes   CCM required this visit?  No     Plan:     I have personally reviewed and noted the following in the patient's chart:   Medical and social history Use of alcohol, tobacco or illicit drugs  Current medications and supplements including opioid prescriptions. Patient is not currently taking  opioid prescriptions. Functional ability and status Nutritional status Physical activity Advanced directives List of other physicians Hospitalizations # 1, surgeries # 1, and ER # 2 visits in previous 12 months Vitals Screenings to include cognitive, depression, and falls Referrals and appointments  In addition, I have reviewed and discussed with patient certain preventive protocols, quality metrics, and best practice recommendations. A written personalized care plan for preventive services as well as general preventive health recommendations were provided to patient.     Richard Fuller, CMA   08/08/2024   After Visit Summary: (MyChart) Due to this being a telephonic visit, the after visit summary with patients personalized plan was offered to patient via MyChart   Nurse Notes:   Richard Fuller is a 68 y.o. male patient of Willo Mini, NP who had a Medicare Annual Wellness Visit today via telephone. Richard Fuller is Disabled and lives with their spouse. He has 7 children and 2 step children. He reports that he is socially active and does interact with friends/family regularly. He is moderately physically active and enjoys lifting weights.

## 2024-08-08 NOTE — Patient Instructions (Signed)
 Mr. Walck , Thank you for taking time to come for your Medicare Wellness Visit. I appreciate your ongoing commitment to your health goals. Please review the following plan we discussed and let me know if I can assist you in the future.   These are the goals we discussed:  Goals       Patient Stated (pt-stated)      07/27/2022 AWV Goal: Tobacco Cessation  Smoking cessation instruction/counseling given:  counseled patient on the dangers of tobacco use, advised patient to stop smoking, and reviewed strategies to maximize success  Patient will verbalize understanding of the health risks associated with smoking/tobacco use Lung cancer or lung disease, such as COPD Heart disease. Stroke. Heart attack Infertility Osteoporosis and bone fractures. Patient will create a plan to quit smoking/using tobacco Pick a date to quit.  Write down the reasons why you are quitting and put it where you will see it often. Identify the people, places, things, and activities that make you want to smoke (triggers) and avoid them. Make sure to take these actions: Throw away all cigarettes at home, at work, and in your car. Throw away smoking accessories, such as Set designer. Clean your car and make sure to empty the ashtray. Clean your home, including curtains and carpets. Tell your family, friends, and coworkers that you are quitting. Support from your loved ones can make quitting easier. Talk with your health care provider about your options for quitting smoking. Find out what treatment options are covered by your health insurance. Patient will be able to demonstrate knowledge of tobacco cessation strategies that may maximize success Quitting "cold [place]" is more successful than gradually quitting. Attending in-person counseling to help you build problem-solving skills.  Finding resources and support systems that can help you to quit smoking and remain smoke-free after you quit. These resources are  most helpful when you use them often. They can include: Online chats with a Veterinary surgeon. Telephone quitlines. Printed Materials engineer. Support groups or group counseling. Text messaging programs. Mobile phone applications. Taking medicines to help you quit smoking: Nicotine  patches, gum, or lozenges. Nicotine  inhalers or sprays. Non-nicotine  medicine that is taken by mouth. Patient will note get discouraged if the process is difficult Over the next year, patient will stop smoking or using other forms of tobacco  Smoking cessation instruction/counseling given:  counseled patient on the dangers of tobacco use, advised patient to stop smoking, and reviewed strategies to maximize success        Patient Stated (pt-stated)      08/01/2023 AWV Goal: Exercise for General Health  Patient will verbalize understanding of the benefits of increased physical activity: Exercising regularly is important. It will improve your overall fitness, flexibility, and endurance. Regular exercise also will improve your overall health. It can help you control your weight, reduce stress, and improve your bone density. Over the next year, patient will increase physical activity as tolerated with a goal of at least 150 minutes of moderate physical activity per week.  You can tell that you are exercising at a moderate intensity if your heart starts beating faster and you start breathing faster but can still hold a conversation. Moderate-intensity exercise ideas include: Walking 1 mile (1.6 km) in about 15 minutes Biking Hiking Golfing Dancing Water aerobics Patient will verbalize understanding of everyday activities that increase physical activity by providing examples like the following: Yard work, such as: Building surveyor  snow Gardening Washing windows or floors Patient will be able to explain general safety guidelines for  exercising:  Before you start a new exercise program, talk with your health care provider. Do not exercise so much that you hurt yourself, feel dizzy, or get very short of breath. Wear comfortable clothes and wear shoes with good support. Drink plenty of water while you exercise to prevent dehydration or heat stroke. Work out until your breathing and your heartbeat get faster.       Patient Stated      Patient states he would like to stay in good shape.         This is a list of the screening recommended for you and due dates:  Health Maintenance  Topic Date Due   Eye exam for diabetics  Never done   COVID-19 Vaccine (3 - Pfizer risk series) 06/15/2020   Zoster (Shingles) Vaccine (2 of 2) 09/24/2022   Colon Cancer Screening  05/04/2023   Complete foot exam   08/17/2023   Screening for Lung Cancer  03/07/2024   Flu Shot  07/26/2024   Hemoglobin A1C  09/01/2024   Yearly kidney health urinalysis for diabetes  03/23/2025   Yearly kidney function blood test for diabetes  05/22/2025   Medicare Annual Wellness Visit  08/08/2025   DTaP/Tdap/Td vaccine (3 - Td or Tdap) 07/16/2034   Pneumococcal Vaccine for age over 38  Completed   Hepatitis C Screening  Completed   HPV Vaccine  Aged Out   Meningitis B Vaccine  Aged Out

## 2024-08-09 NOTE — Telephone Encounter (Signed)
 Letter of results sent to pt

## 2024-08-22 ENCOUNTER — Telehealth: Payer: Self-pay | Admitting: *Deleted

## 2024-08-22 NOTE — Progress Notes (Signed)
 Complex Care Management Note  Care Guide Note 08/22/2024 Name: Claire Bridge MRN: [id number] DOB: 1956/04/03  Treavor Blomquist is a 68 y.o. year old male who sees Willo Mini, NP for primary care. I reached out to [employer] by phone today to offer complex care management services.  Mr. Kean was given information about Complex Care Management services today including:   The Complex Care Management services include support from the care team which includes your Nurse Care Manager, Clinical Social Worker, or Pharmacist.  The Complex Care Management team is here to help remove barriers to the health concerns and goals most important to you. Complex Care Management services are voluntary, and the patient may decline or stop services at any time by request to their care team member.   Complex Care Management Consent Status: Patient agreed to services and verbal consent obtained.   Follow up plan:  Telephone appointment with complex care management team member scheduled for:  08/29/2024  Encounter Outcome:  Patient Scheduled  Thedford Franks, CMA [place]  [facility], [facility] Guide Direct Dial: [phone]  Fax: [phone] Website: [email or web address]

## 2024-08-27 ENCOUNTER — Encounter: Payer: Self-pay | Admitting: Sports Medicine

## 2024-08-28 ENCOUNTER — Telehealth: Payer: Self-pay | Admitting: *Deleted

## 2024-08-28 DIAGNOSIS — Z4789 Encounter for other orthopedic aftercare: Secondary | ICD-10-CM | POA: Diagnosis not present

## 2024-08-28 NOTE — Progress Notes (Signed)
 Complex Care Management Note Care Guide Note  08/28/2024 Name: Richard Fuller MRN: [id number] DOB: 1956/08/15   Complex Care Management Outreach Attempts: An unsuccessful telephone outreach was attempted today to offer the patient information about available complex care management services.  Follow Up Plan:  Additional outreach attempts will be made to offer the patient complex care management information and services.   Encounter Outcome:  No Answer  [facility] Health[facility] Health Care Guide  Direct Dial:[phone] Fax:[phone] Website: [email or web address]

## 2024-08-29 ENCOUNTER — Other Ambulatory Visit: Payer: Self-pay | Admitting: Licensed Clinical Social Worker

## 2024-08-29 NOTE — Patient Instructions (Signed)
 Visit Information  Thank you for taking time to visit with me today. Please don't hesitate to contact me if I can be of assistance to you before our next scheduled appointment.   Please call the Suicide and Crisis Lifeline: 988 if you are experiencing a Mental Health or Behavioral Health Crisis or need someone to talk to.  Patient verbalizes understanding of instructions and care plan provided today and agrees to view in MyChart. Active MyChart status and patient understanding of how to access instructions and care plan via MyChart confirmed with patient.     Alm Armor, LCSW [place], [facility] Licensed Clinical Social Worker Care Coordinator [phone]

## 2024-08-29 NOTE — Patient Outreach (Signed)
 Complex Care Management   Visit Note  08/29/2024  Name:  Richard Fuller MRN: [id number] DOB: 1956/10/07  Situation: Referral received for Complex Care Management related to SDOH Barriers:  Food insecurity I obtained verbal consent from Patient.  Visit completed with Patient  on the phone  Background:   Past Medical History:  Diagnosis Date   Allergy 1985   Anemia 2015   Anxiety    Arthritis 2000   Depression    Diabetes mellitus without complication (HCC)    GERD (gastroesophageal reflux disease) 2004   Heart murmur 1996   Hyperlipidemia    Hypertension    Osteoporosis 1996    Assessment: Patient Reported Symptoms:  Cognitive Cognitive Status: Alert and oriented to person, place, and time, Normal speech and language skills, Insightful and able to interpret abstract concepts Cognitive/Intellectual Conditions Management [RPT]: None reported or documented in medical history or problem list   Health Maintenance Behaviors: Annual physical exam, Healthy diet, Exercise Healing Pattern: Unsure Health Facilitated by: Healthy diet  Neurological Neurological Review of Symptoms: No symptoms reported Neurological Management Strategies: Routine screening Neurological Self-Management Outcome: 4 (good)  HEENT HEENT Symptoms Reported: No symptoms reported HEENT Management Strategies: Routine screening HEENT Self-Management Outcome: 4 (good)    Cardiovascular Cardiovascular Symptoms Reported: Other: Other Cardiovascular Symptoms: Hypertension Does patient have uncontrolled Hypertension?: No Cardiovascular Management Strategies: Activity, Medication therapy Cardiovascular Self-Management Outcome: 4 (good)  Respiratory Respiratory Symptoms Reported: No symptoms reported Respiratory Self-Management Outcome: 4 (good)  Endocrine Is patient diabetic?: Yes Is patient checking blood sugars at home?: Yes List most recent blood sugar readings, include date and time of day: 130 Endocrine  Self-Management Outcome: 4 (good)  Gastrointestinal Gastrointestinal Symptoms Reported: Cramping Additional Gastrointestinal Details: GERD - Followed by provider Gastrointestinal Management Strategies: Diet modification Gastrointestinal Self-Management Outcome: 4 (good)    Genitourinary Genitourinary Symptoms Reported: No symptoms reported    Integumentary Integumentary Symptoms Reported: No symptoms reported    Musculoskeletal Musculoskelatal Symptoms Reviewed: Difficulty walking, Limited mobility Additional Musculoskeletal Details: Had to have toe surgery due to laqnmower accident - being followed by specialist Musculoskeletal Management Strategies: Activity, Coping strategies, Medical device, Medication therapy Musculoskeletal Self-Management Outcome: 4 (good) Falls in the past year?: Yes Number of falls in past year: 1 or less Was there an injury with Fall?: No Fall Risk Category Calculator: 1 Patient Fall Risk Level: Low Fall Risk Patient at Risk for Falls Due to: No Fall Risks  Psychosocial Psychosocial Symptoms Reported: No symptoms reported     Quality of Family Relationships: helpful, involved, supportive Do you feel physically threatened by others?: No    08/29/2024    PHQ2-9 Depression Screening   Little interest or pleasure in doing things Not at all  Feeling down, depressed, or hopeless Not at all  PHQ-2 - Total Score 0  Trouble falling or staying asleep, or sleeping too much    Feeling tired or having little energy    Poor appetite or overeating     Feeling bad about yourself - or that you are a failure or have let yourself or your family down    Trouble concentrating on things, such as reading the newspaper or watching television    Moving or speaking so slowly that other people could have noticed.  Or the opposite - being so fidgety or restless that you have been moving around a lot more than usual    Thoughts that you would be better off dead, or hurting yourself  in some way  PHQ2-9 Total Score    If you checked off any problems, how difficult have these problems made it for you to do your work, take care of things at home, or get along with other people    Depression Interventions/Treatment      There were no vitals filed for this visit.  Medications Reviewed Today     Reviewed by Kit Alm LABOR, LCSW (Social Worker) on 08/29/24 at [id number]  Med List Status: <None>   Medication Order Taking? Sig Documenting Provider Last Dose Status Informant  amLODipine  (NORVASC ) 5 MG tablet [id number] Yes TAKE 1 TABLET(5 MG) BY MOUTH DAILY Jessup, Joy, NP  Active   ASPIRIN  LOW DOSE 81 MG tablet [id number] Yes Take 1 tablet (81 mg total) by mouth daily. Willo Mini, NP  Active   Blood Glucose Monitoring Suppl (ONE TOUCH ULTRA 2) w/Device KIT [id number] Yes Check fasting blood glucose daily and 2 hours after largest meal a couple days a week. Willo Mini, NP  Active   carvedilol  (COREG ) 25 MG tablet [id number] Yes Take 1 tablet (25 mg total) by mouth 2 (two) times daily with a meal. Pietro Redell RAMAN, MD  Active   clotrimazole -betamethasone  (LOTRISONE ) cream [id number] Yes Apply 1 Application topically daily. Willo Mini, NP  Active   cyclobenzaprine  (FLEXERIL ) 10 MG tablet [id number]  Take 1 tablet (10 mg total) by mouth 3 (three) times daily as needed for muscle spasms.  Patient not taking: Reported on 08/08/2024   Willo Mini, NP  Active   enalapril  (VASOTEC ) 20 MG tablet [id number] Yes TAKE 1 TABLET(20 MG) BY MOUTH TWICE DAILY Willo Mini, NP  Active   escitalopram  (LEXAPRO ) 10 MG tablet [id number] Yes Take 1 tablet (10 mg total) by mouth daily. Willo Mini, NP  Active   famotidine  (PEPCID ) 20 MG tablet [id number] Yes Take 1 tablet (20 mg total) by mouth 2 (two) times daily. Willo Mini, NP  Active   fenofibrate  (TRICOR ) 48 MG tablet [id number] Yes Take 1 tablet (48 mg total) by mouth daily. Willo Mini, NP  Active   gabapentin  (NEURONTIN ) 300 MG capsule [id number] Yes Take  2 capsules (600 mg total) by mouth every morning AND 2-3 capsules (600-900 mg total) at bedtime. Willo Mini, NP  Active   glucose blood (ONETOUCH ULTRA TEST) test strip [id number] Yes Check fasting blood glucose daily and 2 hours after largest meal a couple days a week. Willo Mini, NP  Active   ibuprofen  (ADVIL ) 600 MG tablet [id number] Yes Take 1 tablet (600 mg total) by mouth every 8 (eight) hours as needed. Willo Mini, NP  Active   Lancets 30G MISC [id number] Yes Check fasting blood glucose daily and 2 hours after largest meal a couple days a week. Willo Mini, NP  Active   lovastatin  (MEVACOR ) 10 MG tablet [id number] Yes TAKE 1 TABLET(10 MG) BY MOUTH AT BEDTIME Jessup, Joy, NP  Active   metFORMIN  (GLUCOPHAGE -XR) 750 MG 24 hr tablet [id number] Yes Take 1 tablet (750 mg total) by mouth daily with breakfast. Willo Mini, NP  Active   pantoprazole  (PROTONIX ) 20 MG tablet [id number]  Take 1 tablet (20 mg total) by mouth daily.  Patient not taking: Reported on 08/08/2024   Willo Mini, NP  Active   zolpidem  (AMBIEN ) 5 MG tablet [id number] Yes Take 1 tablet (5 mg total) by mouth at bedtime as needed for sleep. Willo Mini, NP  Active             Recommendation:   CSW sent list  of community resources/food banks to patient, reviewed usual process for connecting with resources. Pt voiced confidence in being able to reach resources.    Follow Up Plan:   Patient has met all care management goals. Care Management case will be closed. Patient has been provided contact information should new needs arise.   Alm Armor, LCSW [facility], [facility] Licensed Clinical Social Worker Care Coordinator [phone]

## 2024-08-30 ENCOUNTER — Ambulatory Visit: Admitting: Medical-Surgical

## 2024-08-30 ENCOUNTER — Encounter: Payer: Self-pay | Admitting: Medical-Surgical

## 2024-08-30 ENCOUNTER — Telehealth: Payer: Self-pay | Admitting: *Deleted

## 2024-08-30 VITALS — BP 129/70 | HR 74 | Resp 20 | Ht 71.5 in | Wt 189.0 lb

## 2024-08-30 DIAGNOSIS — M961 Postlaminectomy syndrome, not elsewhere classified: Secondary | ICD-10-CM

## 2024-08-30 DIAGNOSIS — F17200 Nicotine dependence, unspecified, uncomplicated: Secondary | ICD-10-CM | POA: Diagnosis not present

## 2024-08-30 DIAGNOSIS — Z23 Encounter for immunization: Secondary | ICD-10-CM

## 2024-08-30 DIAGNOSIS — E1151 Type 2 diabetes mellitus with diabetic peripheral angiopathy without gangrene: Secondary | ICD-10-CM | POA: Diagnosis not present

## 2024-08-30 DIAGNOSIS — I10 Essential (primary) hypertension: Secondary | ICD-10-CM

## 2024-08-30 DIAGNOSIS — E782 Mixed hyperlipidemia: Secondary | ICD-10-CM | POA: Diagnosis not present

## 2024-08-30 DIAGNOSIS — Z7984 Long term (current) use of oral hypoglycemic drugs: Secondary | ICD-10-CM | POA: Diagnosis not present

## 2024-08-30 MED ORDER — CARVEDILOL 25 MG PO TABS: 25.0000 mg | ORAL_TABLET | Freq: Two times a day (BID) | ORAL | 3 refills | Status: AC

## 2024-08-30 MED ORDER — GABAPENTIN 300 MG PO CAPS: ORAL_CAPSULE | ORAL | 3 refills | Status: DC

## 2024-08-30 NOTE — Progress Notes (Signed)
 Complex Care Management Note Care Guide Note  08/30/2024 Name: Richard Fuller MRN: [id number] DOB: 08/14/1956   Complex Care Management Outreach Attempts: An unsuccessful outreach was attempted for an appointment today.  Follow Up Plan:  Additional outreach attempts will be made to offer the patient complex care management information and services.   Encounter Outcome:  No Answer .j

## 2024-08-30 NOTE — Progress Notes (Signed)
 Established patient visit   History of Present Illness   Discussed the use of AI scribe software for clinical note transcription with the patient, who gave verbal consent to proceed.  History of Present Illness   Richard Fuller is a 68 year old male with diabetes who presents for follow-up after a toe injury.  Toe laceration and post-surgical recovery - Sustained a laceration to the big toe while cutting grass, resulting in a cut tendon requiring surgical repair - Began weight-bearing on the affected toe as of yesterday - Being followed by podiatry  Glycemic control - Diabetes mellitus with hemoglobin A1c of 6.6% in July - Takes metformin  750mg  daily - Blood glucose readings are stable  Musculoskeletal pain and activity limitation - Intermittent back pain, worsened due to cessation of gym activities following toe injury - Previously attended gym every other day, but has not attended for four to five weeks - Takes gabapentin , two tablets in the morning and three at night   Tobacco use - Reduced smoking recently - No tobacco use in the past week due to illness  Sleep disturbance - Difficulty with sleep - Takes Ambien  5mg  nightly prn, which is effective  Cardiovascular and metabolic management - Takes carvedilol  25mg  BID, enalapril  20mg  BID, amlodipine  5mg  daily, lovastatin  10mg  daily, and fenofibrate  48mg  daily for blood pressure and cholesterol management - Blood pressure is stable on current doses - No chest pain, shortness of breath, or palpitations      Physical Exam   Physical Exam Vitals reviewed.  Constitutional:      General: He is not in acute distress.    Appearance: Normal appearance. He is not ill-appearing.  HENT:     Head: Normocephalic and atraumatic.  Cardiovascular:     Rate and Rhythm: Normal rate and regular rhythm.     Pulses: Normal pulses.     Heart sounds: Normal heart sounds. No murmur heard.    No friction rub. No gallop.  Pulmonary:      Effort: Pulmonary effort is normal. No respiratory distress.     Breath sounds: Normal breath sounds.  Musculoskeletal:     Comments: Post-op shoe on the left foot  Skin:    General: Skin is warm and dry.  Neurological:     Mental Status: He is alert and oriented to person, place, and time.  Psychiatric:        Mood and Affect: Mood normal.        Behavior: Behavior normal.        Thought Content: Thought content normal.        Judgment: Judgment normal.    Assessment & Plan   Assessment and Plan    Type 2 diabetes mellitus with peripheral angiopathy Type 2 diabetes with peripheral angiopathy, A1c 6.6, stable glucose levels. - Continue current diabetes management regimen. - Recheck A1c in January or early February.  Essential hypertension Well-controlled hypertension with medication, no recent symptoms. - Continue current antihypertensive regimen. - Refill carvedilol  prescription.  Mixed hyperlipidemia Hyperlipidemia, cholesterol recheck needed. - Order cholesterol test today. - Continue lovastatin  and fenofibrate .  Chronic back pain Intermittent back pain, not exacerbated, plans to resume exercise. - Encourage return to regular exercise as tolerated. - Continue gabapentin  as prescribed.   Tobacco use disorder Reduced smoking recently due to illness. - Recommend smoking cessation - Lung cancer screening ordered.   Need for flu vaccine - Flu vaccine given in office today.     Follow up  Return in about 5 months (around 01/30/2025) for DM/HTN/HLD follow up. __________________________________ Zada FREDRIK Palin, DNP, APRN, FNP-BC Primary Care and Sports Medicine [facility] [place]

## 2024-08-31 LAB — LIPID PANEL
Chol/HDL Ratio: 3.3 ratio (ref 0.0–5.0)
Cholesterol, Total: 127 mg/dL (ref 100–199)
HDL: 38 mg/dL — ABNORMAL LOW (ref >39)
LDL Chol Calc (NIH): 69 mg/dL (ref 0–99)
Triglycerides: 106 mg/dL (ref 0–149)
VLDL Cholesterol Cal: 20 mg/dL (ref 5–40)

## 2024-09-01 ENCOUNTER — Ambulatory Visit: Payer: Self-pay | Admitting: Medical-Surgical

## 2024-09-01 DIAGNOSIS — N281 Cyst of kidney, acquired: Secondary | ICD-10-CM

## 2024-09-05 LAB — HM DIABETES EYE EXAM

## 2024-09-06 ENCOUNTER — Encounter: Payer: Self-pay | Admitting: Medical-Surgical

## 2024-09-10 ENCOUNTER — Ambulatory Visit (INDEPENDENT_AMBULATORY_CARE_PROVIDER_SITE_OTHER)

## 2024-09-10 DIAGNOSIS — F17210 Nicotine dependence, cigarettes, uncomplicated: Secondary | ICD-10-CM

## 2024-09-10 DIAGNOSIS — Z1231 Encounter for screening mammogram for malignant neoplasm of breast: Secondary | ICD-10-CM

## 2024-09-12 DIAGNOSIS — G43909 Migraine, unspecified, not intractable, without status migrainosus: Secondary | ICD-10-CM | POA: Diagnosis not present

## 2024-09-12 DIAGNOSIS — E663 Overweight: Secondary | ICD-10-CM | POA: Diagnosis not present

## 2024-09-12 DIAGNOSIS — Z008 Encounter for other general examination: Secondary | ICD-10-CM | POA: Diagnosis not present

## 2024-09-12 DIAGNOSIS — J439 Emphysema, unspecified: Secondary | ICD-10-CM | POA: Diagnosis not present

## 2024-09-12 DIAGNOSIS — Z6825 Body mass index (BMI) 25.0-25.9, adult: Secondary | ICD-10-CM | POA: Diagnosis not present

## 2024-09-18 NOTE — Progress Notes (Signed)
 Richard Fuller                                          MRN: [id number]   09/18/2024   The [facility] Quality Team Specialist reviewed this patient medical record for the purposes of chart review for care gap closure. The following were reviewed: abstraction for care gap closure-glycemic status assessment.    [facility] Quality Team

## 2024-09-19 NOTE — Progress Notes (Signed)
 Curley Hogen                                          MRN: [id number]   09/19/2024   The [facility] Quality Team Specialist reviewed this patient medical record for the purposes of chart review for care gap closure. The following were reviewed: abstraction for care gap closure-glycemic status assessment.    [facility] Quality Team

## 2024-09-20 DIAGNOSIS — Z4789 Encounter for other orthopedic aftercare: Secondary | ICD-10-CM | POA: Diagnosis not present

## 2024-09-20 DIAGNOSIS — Z1330 Encounter for screening examination for mental health and behavioral disorders, unspecified: Secondary | ICD-10-CM | POA: Diagnosis not present

## 2024-09-26 ENCOUNTER — Other Ambulatory Visit

## 2024-10-03 DIAGNOSIS — Z4789 Encounter for other orthopedic aftercare: Secondary | ICD-10-CM | POA: Diagnosis not present

## 2024-10-28 ENCOUNTER — Encounter: Payer: Self-pay | Admitting: Medical-Surgical

## 2024-10-28 DIAGNOSIS — M961 Postlaminectomy syndrome, not elsewhere classified: Secondary | ICD-10-CM

## 2024-10-28 MED ORDER — GABAPENTIN 300 MG PO CAPS: ORAL_CAPSULE | ORAL | 3 refills | Status: AC

## 2024-10-28 MED ORDER — ZOLPIDEM TARTRATE 5 MG PO TABS: 5.0000 mg | ORAL_TABLET | Freq: Every evening | ORAL | 1 refills | Status: AC | PRN

## 2024-11-25 ENCOUNTER — Telehealth: Payer: Self-pay

## 2024-11-25 NOTE — Telephone Encounter (Signed)
 Copied from CRM #[id number]. Topic: Medical Record Request - Provider/Facility Request >> Nov 25, 2024  1:21 PM Nathanel BROCKS wrote: Reason for CRM: [facility] needs this pts records please send them to fax [phone]. She stated that she sent over on 11/29 and resending now.

## 2024-12-06 ENCOUNTER — Encounter: Payer: Self-pay | Admitting: Medical-Surgical

## 2024-12-06 MED ORDER — IBUPROFEN 600 MG PO TABS: 600.0000 mg | ORAL_TABLET | Freq: Three times a day (TID) | ORAL | 3 refills | Status: AC | PRN

## 2025-01-10 ENCOUNTER — Other Ambulatory Visit: Payer: Self-pay

## 2025-01-10 MED ORDER — FAMOTIDINE 20 MG PO TABS: 20.0000 mg | ORAL_TABLET | Freq: Two times a day (BID) | ORAL | 0 refills | Status: AC

## 2025-01-30 ENCOUNTER — Ambulatory Visit: Admitting: Medical-Surgical

## 2025-02-07 ENCOUNTER — Ambulatory Visit: Admitting: Medical-Surgical

## 2025-08-12 ENCOUNTER — Ambulatory Visit
